# Patient Record
Sex: Female | Born: 1965
Health system: Southern US, Community
[De-identification: ages and names within clinical notes are randomized; demographics above are authoritative.]

## PROBLEM LIST (undated history)

## (undated) DIAGNOSIS — K635 Polyp of colon: Secondary | ICD-10-CM

## (undated) HISTORY — PX: KNEE SURGERY: SHX244

## (undated) HISTORY — PX: ABDOMINAL HYSTERECTOMY: SHX81

## (undated) HISTORY — DX: Polyp of colon: K63.5

---

## 1996-08-02 HISTORY — PX: ABDOMINAL HYSTERECTOMY: SHX81

## 1999-05-03 ENCOUNTER — Encounter: Payer: Self-pay | Admitting: Emergency Medicine

## 1999-05-03 ENCOUNTER — Emergency Department (HOSPITAL_COMMUNITY): Admission: EM | Admit: 1999-05-03 | Discharge: 1999-05-03 | Payer: Self-pay | Admitting: Emergency Medicine

## 1999-05-11 ENCOUNTER — Other Ambulatory Visit: Admission: RE | Admit: 1999-05-11 | Discharge: 1999-05-11 | Payer: Self-pay | Admitting: Obstetrics and Gynecology

## 1999-06-30 ENCOUNTER — Encounter: Admission: RE | Admit: 1999-06-30 | Discharge: 1999-06-30 | Payer: Self-pay | Admitting: Obstetrics and Gynecology

## 1999-06-30 ENCOUNTER — Encounter: Payer: Self-pay | Admitting: Obstetrics and Gynecology

## 1999-09-29 ENCOUNTER — Emergency Department (HOSPITAL_COMMUNITY): Admission: EM | Admit: 1999-09-29 | Discharge: 1999-09-29 | Payer: Self-pay | Admitting: Emergency Medicine

## 2000-07-18 ENCOUNTER — Other Ambulatory Visit: Admission: RE | Admit: 2000-07-18 | Discharge: 2000-07-18 | Payer: Self-pay | Admitting: Obstetrics and Gynecology

## 2000-08-25 ENCOUNTER — Encounter: Admission: RE | Admit: 2000-08-25 | Discharge: 2000-08-25 | Payer: Self-pay | Admitting: Obstetrics and Gynecology

## 2000-08-25 ENCOUNTER — Encounter: Payer: Self-pay | Admitting: Obstetrics and Gynecology

## 2001-08-29 ENCOUNTER — Encounter: Payer: Self-pay | Admitting: Obstetrics and Gynecology

## 2001-08-29 ENCOUNTER — Encounter: Admission: RE | Admit: 2001-08-29 | Discharge: 2001-08-29 | Payer: Self-pay | Admitting: Obstetrics and Gynecology

## 2001-09-01 ENCOUNTER — Other Ambulatory Visit: Admission: RE | Admit: 2001-09-01 | Discharge: 2001-09-01 | Payer: Self-pay | Admitting: Obstetrics and Gynecology

## 2005-01-04 ENCOUNTER — Encounter: Admission: RE | Admit: 2005-01-04 | Discharge: 2005-01-04 | Payer: Self-pay | Admitting: Obstetrics and Gynecology

## 2005-08-18 ENCOUNTER — Emergency Department (HOSPITAL_COMMUNITY): Admission: EM | Admit: 2005-08-18 | Discharge: 2005-08-18 | Payer: Self-pay | Admitting: Family Medicine

## 2006-03-16 ENCOUNTER — Other Ambulatory Visit: Admission: RE | Admit: 2006-03-16 | Discharge: 2006-03-16 | Payer: Self-pay | Admitting: Family Medicine

## 2006-03-18 ENCOUNTER — Encounter: Admission: RE | Admit: 2006-03-18 | Discharge: 2006-03-18 | Payer: Self-pay | Admitting: Family Medicine

## 2007-03-23 ENCOUNTER — Emergency Department (HOSPITAL_COMMUNITY): Admission: EM | Admit: 2007-03-23 | Discharge: 2007-03-23 | Payer: Self-pay | Admitting: Emergency Medicine

## 2007-05-12 ENCOUNTER — Other Ambulatory Visit: Admission: RE | Admit: 2007-05-12 | Discharge: 2007-05-12 | Payer: Self-pay | Admitting: Family Medicine

## 2007-06-27 ENCOUNTER — Ambulatory Visit (HOSPITAL_COMMUNITY): Admission: RE | Admit: 2007-06-27 | Discharge: 2007-06-27 | Payer: Self-pay | Admitting: Family Medicine

## 2008-03-20 ENCOUNTER — Emergency Department (HOSPITAL_COMMUNITY): Admission: EM | Admit: 2008-03-20 | Discharge: 2008-03-20 | Payer: Self-pay | Admitting: Emergency Medicine

## 2008-06-14 ENCOUNTER — Ambulatory Visit (HOSPITAL_COMMUNITY): Admission: RE | Admit: 2008-06-14 | Discharge: 2008-06-14 | Payer: Self-pay | Admitting: Family Medicine

## 2008-10-07 ENCOUNTER — Emergency Department (HOSPITAL_COMMUNITY): Admission: EM | Admit: 2008-10-07 | Discharge: 2008-10-07 | Payer: Self-pay | Admitting: Emergency Medicine

## 2008-10-07 ENCOUNTER — Encounter (INDEPENDENT_AMBULATORY_CARE_PROVIDER_SITE_OTHER): Payer: Self-pay | Admitting: Emergency Medicine

## 2008-10-07 ENCOUNTER — Ambulatory Visit: Payer: Self-pay | Admitting: Surgery

## 2008-12-02 ENCOUNTER — Encounter: Admission: RE | Admit: 2008-12-02 | Discharge: 2008-12-02 | Payer: Self-pay | Admitting: Family Medicine

## 2009-01-31 ENCOUNTER — Other Ambulatory Visit: Admission: RE | Admit: 2009-01-31 | Discharge: 2009-01-31 | Payer: Self-pay | Admitting: Family Medicine

## 2009-07-14 ENCOUNTER — Ambulatory Visit (HOSPITAL_COMMUNITY): Admission: RE | Admit: 2009-07-14 | Discharge: 2009-07-14 | Payer: Self-pay | Admitting: Family Medicine

## 2010-08-23 ENCOUNTER — Encounter: Payer: Self-pay | Admitting: Family Medicine

## 2011-03-31 ENCOUNTER — Other Ambulatory Visit (HOSPITAL_COMMUNITY): Payer: Self-pay | Admitting: Family Medicine

## 2011-03-31 DIAGNOSIS — Z1231 Encounter for screening mammogram for malignant neoplasm of breast: Secondary | ICD-10-CM

## 2011-04-09 ENCOUNTER — Ambulatory Visit (HOSPITAL_COMMUNITY): Payer: Self-pay | Attending: Family Medicine

## 2011-06-02 ENCOUNTER — Ambulatory Visit (HOSPITAL_COMMUNITY)
Admission: RE | Admit: 2011-06-02 | Discharge: 2011-06-02 | Disposition: A | Discharge status: Home / Self Care | Payer: Self-pay | Source: Ambulatory Visit | Attending: Family Medicine | Admitting: Family Medicine

## 2011-06-02 DIAGNOSIS — Z1231 Encounter for screening mammogram for malignant neoplasm of breast: Secondary | ICD-10-CM | POA: Insufficient documentation

## 2011-11-12 ENCOUNTER — Encounter (HOSPITAL_COMMUNITY): Payer: Self-pay | Admitting: Emergency Medicine

## 2011-11-12 ENCOUNTER — Emergency Department (HOSPITAL_COMMUNITY)
Admission: EM | Admit: 2011-11-12 | Discharge: 2011-11-12 | Disposition: A | Discharge status: Home / Self Care | Payer: Managed Care, Other (non HMO) | Attending: Emergency Medicine | Admitting: Emergency Medicine

## 2011-11-12 DIAGNOSIS — R079 Chest pain, unspecified: Secondary | ICD-10-CM | POA: Insufficient documentation

## 2011-11-12 DIAGNOSIS — R51 Headache: Secondary | ICD-10-CM

## 2011-11-12 MED ORDER — DIPHENHYDRAMINE HCL 50 MG/ML IJ SOLN
25.0000 mg | Freq: Once | INTRAMUSCULAR | Status: DC
  Filled 2011-11-12: qty 1

## 2011-11-12 MED ORDER — KETOROLAC TROMETHAMINE 15 MG/ML IJ SOLN
15.0000 mg | Freq: Once | INTRAMUSCULAR | Status: DC
  Filled 2011-11-12: qty 1

## 2011-11-12 MED ORDER — METOCLOPRAMIDE HCL 5 MG/ML IJ SOLN
10.0000 mg | Freq: Once | INTRAMUSCULAR | Status: DC
  Filled 2011-11-12: qty 2

## 2011-11-12 MED ORDER — IBUPROFEN 400 MG PO TABS: 400.0000 mg | ORAL_TABLET | Freq: Four times a day (QID) | ORAL | Status: AC | PRN

## 2011-11-12 MED ORDER — SODIUM CHLORIDE 0.9 % IV BOLUS (SEPSIS): 1000.0000 mL | Freq: Once | INTRAVENOUS | Status: DC

## 2011-11-12 NOTE — Discharge Instructions (Signed)
Headache, General, Unknown Cause  The specific cause of your headache may not have been found today. There are many causes and types of headache. A few common ones are:   Tension headache.    Migraine.    Infections (examples: dental and sinus infections).    Bone and/or joint problems in the neck or jaw.    Depression.    Eye problems.   These headaches are not life threatening.    Headaches can sometimes be diagnosed by a patient history and a physical exam. Sometimes, lab and imaging studies (such as x-ray and/or CT scan) are used to rule out more serious problems. In some cases, a spinal tap (lumbar puncture) may be requested. There are many times when your exam and tests may be normal on the first visit even when there is a serious problem causing your headaches. Because of that, it is very important to follow up with your doctor or local clinic for further evaluation.  FINDING OUT THE RESULTS OF TESTS   If a radiology test was performed, a radiologist will review your results.    You will be contacted by the emergency department or your physician if any test results require a change in your treatment plan.    Not all test results may be available during your visit. If your test results are not back during the visit, make an appointment with your caregiver to find out the results. Do not assume everything is normal if you have not heard from your caregiver or the medical facility. It is important for you to follow up on all of your test results.   HOME CARE INSTRUCTIONS     Keep follow-up appointments with your caregiver, or any specialist referral.    Only take over-the-counter or prescription medicines for pain, discomfort, or fever as directed by your caregiver.    Biofeedback, massage, or other relaxation techniques may be helpful.    Ice packs or heat applied to the head and neck can be used. Do this three to four times per day, or as needed.     Call your doctor if you have any questions or concerns.    If you smoke, you should quit.   SEEK MEDICAL CARE IF:     You develop problems with medications prescribed.    You do not respond to or obtain relief from medications.    You have a change from the usual headache.    You develop nausea or vomiting.   SEEK IMMEDIATE MEDICAL CARE IF:     If your headache becomes severe.    You have an unexplained oral temperature above 102 F (38.9 C), or as your caregiver suggests.    You have a stiff neck.    You have loss of vision.    You have muscular weakness.    You have loss of muscular control.    You develop severe symptoms different from your first symptoms.    You start losing your balance or have trouble walking.    You feel faint or pass out.   MAKE SURE YOU:     Understand these instructions.    Will watch your condition.    Will get help right away if you are not doing well or get worse.   Document Released: 07/19/2005 Document Revised: 07/08/2011 Document Reviewed: 03/07/2008  [employer] Patient Information 2012 [employer].    Headaches, Frequently Asked Questions  MIGRAINE HEADACHES  Q: What is migraine? What causes it? How   can I treat it?  A: Generally, migraine headaches begin as a dull ache. Then they develop into a constant, throbbing, and pulsating pain. You may experience pain at the temples. You may experience pain at the front or back of one or both sides of the head. The pain is usually accompanied by a combination of:   Nausea.    Vomiting.    Sensitivity to light and noise.   Some people (about 15%) experience an aura (see below) before an attack. The cause of migraine is believed to be chemical reactions in the brain. Treatment for migraine may include over-the-counter or prescription medications. It may also include self-help techniques. These include relaxation training and biofeedback.    Q: What is an aura?   A: About 15% of people with migraine get an "aura". This is a sign of neurological symptoms that occur before a migraine headache. You may see wavy or jagged lines, dots, or flashing lights. You might experience tunnel vision or blind spots in one or both eyes. The aura can include visual or auditory hallucinations (something imagined). It may include disruptions in smell (such as strange odors), taste or touch. Other symptoms include:   Numbness.    A "pins and needles" sensation.    Difficulty in recalling or speaking the correct word.   These neurological events may last as long as 60 minutes. These symptoms will fade as the headache begins.  Q: What is a trigger?  A: Certain physical or environmental factors can lead to or "trigger" a migraine. These include:   Foods.    Hormonal changes.    Weather.    Stress.   It is important to remember that triggers are different for everyone. To help prevent migraine attacks, you need to figure out which triggers affect you. Keep a headache diary. This is a good way to track triggers. The diary will help you talk to your healthcare professional about your condition.  Q: Does weather affect migraines?  A: Bright sunshine, hot, humid conditions, and drastic changes in barometric pressure may lead to, or "trigger," a migraine attack in some people. But studies have shown that weather does not act as a trigger for everyone with migraines.  Q: What is the link between migraine and hormones?  A: Hormones start and regulate many of your body's functions. Hormones keep your body in balance within a constantly changing environment. The levels of hormones in your body are unbalanced at times. Examples are during menstruation, pregnancy, or menopause. That can lead to a migraine attack. In fact, about three quarters of all women with migraine report that their attacks are related to the menstrual cycle.    Q: Is there an increased risk of stroke for migraine sufferers?   A: The likelihood of a migraine attack causing a stroke is very remote. That is not to say that migraine sufferers cannot have a stroke associated with their migraines. In persons under age 40, the most common associated factor for stroke is migraine headache. But over the course of a person's normal life span, the occurrence of migraine headache may actually be associated with a reduced risk of dying from cerebrovascular disease due to stroke.    Q: What are acute medications for migraine?  A: Acute medications are used to treat the pain of the headache after it has started. Examples over-the-counter medications, NSAIDs, ergots, and triptans.    Q: What are the triptans?  A: Triptans are the newest class of abortive   medications. They are specifically targeted to treat migraine. Triptans are vasoconstrictors. They moderate some chemical reactions in the brain. The triptans work on receptors in your brain. Triptans help to restore the balance of a neurotransmitter called serotonin. Fluctuations in levels of serotonin are thought to be a main cause of migraine.    Q: Are over-the-counter medications for migraine effective?  A: Over-the-counter, or "OTC," medications may be effective in relieving mild to moderate pain and associated symptoms of migraine. But you should see your caregiver before beginning any treatment regimen for migraine.    Q: What are preventive medications for migraine?  A: Preventive medications for migraine are sometimes referred to as "prophylactic" treatments. They are used to reduce the frequency, severity, and length of migraine attacks. Examples of preventive medications include antiepileptic medications, antidepressants, beta-blockers, calcium channel blockers, and NSAIDs (nonsteroidal anti-inflammatory drugs).  Q: Why are anticonvulsants used to treat migraine?   A: During the past few years, there has been an increased interest in antiepileptic drugs for the prevention of migraine. They are sometimes referred to as "anticonvulsants". Both epilepsy and migraine may be caused by similar reactions in the brain.    Q: Why are antidepressants used to treat migraine?  A: Antidepressants are typically used to treat people with depression. They may reduce migraine frequency by regulating chemical levels, such as serotonin, in the brain.    Q: What alternative therapies are used to treat migraine?  A: The term "alternative therapies" is often used to describe treatments considered outside the scope of conventional Western medicine. Examples of alternative therapy include acupuncture, acupressure, and yoga. Another common alternative treatment is herbal therapy. Some herbs are believed to relieve headache pain. Always discuss alternative therapies with your caregiver before proceeding. Some herbal products contain arsenic and other toxins.  TENSION HEADACHES  Q: What is a tension-type headache? What causes it? How can I treat it?  A: Tension-type headaches occur randomly. They are often the result of temporary stress, anxiety, fatigue, or anger. Symptoms include soreness in your temples, a tightening band-like sensation around your head (a "vice-like" ache). Symptoms can also include a pulling feeling, pressure sensations, and contracting head and neck muscles. The headache begins in your forehead, temples, or the back of your head and neck. Treatment for tension-type headache may include over-the-counter or prescription medications. Treatment may also include self-help techniques such as relaxation training and biofeedback.  CLUSTER HEADACHES  Q: What is a cluster headache? What causes it? How can I treat it?   A: Cluster headache gets its name because the attacks come in groups. The pain arrives with little, if any, warning. It is usually on one side of the head. A tearing or bloodshot eye and a runny nose on the same side of the headache may also accompany the pain. Cluster headaches are believed to be caused by chemical reactions in the brain. They have been described as the most severe and intense of any headache type. Treatment for cluster headache includes prescription medication and oxygen.  SINUS HEADACHES  Q: What is a sinus headache? What causes it? How can I treat it?  A: When a cavity in the bones of the face and skull (a sinus) becomes inflamed, the inflammation will cause localized pain. This condition is usually the result of an allergic reaction, a tumor, or an infection. If your headache is caused by a sinus blockage, such as an infection, you will probably have a fever. An x-ray will confirm a sinus   that is being overused. Sometimes your caregiver will gradually substitute a different type of treatment or medication. Stopping may be a challenge. Regularly overusing a medication increases the potential for serious side effects. Consult a caregiver if you regularly use headache medications more than 2 days per week or more than the label advises. ADDITIONAL QUESTIONS AND ANSWERS Q: What is biofeedback? A: Biofeedback is a self-help treatment. Biofeedback uses special equipment to monitor your body's involuntary physical responses. Biofeedback monitors:  Breathing.   Pulse.   Heart rate.   Temperature.   Muscle tension.   Brain activity.  Biofeedback helps you refine and perfect your relaxation exercises. You learn to control the physical  responses that are related to stress. Once the technique has been mastered, you do not need the equipment any more. Q: Are headaches hereditary? A: Four out of five (80%) of people that suffer report a family history of migraine. Scientists are not sure if this is genetic or a family predisposition. Despite the uncertainty, a child has a 50% chance of having migraine if one parent suffers. The child has a 75% chance if both parents suffer.  Q: Can children get headaches? A: By the time they reach high school, most young people have experienced some type of headache. Many safe and effective approaches or medications can prevent a headache from occurring or stop it after it has begun.  Q: What type of doctor should I see to diagnose and treat my headache? A: Start with your primary caregiver. Discuss his or her experience and approach to headaches. Discuss methods of classification, diagnosis, and treatment. Your caregiver may decide to recommend you to a headache specialist, depending upon your symptoms or other physical conditions. Having diabetes, allergies, etc., may require a more comprehensive and inclusive approach to your headache. The National Headache Foundation will provide, upon request, a list of [facility] physician members in your state. Document Released: 10/09/2003 Document Revised: 07/08/2011 Document Reviewed: 03/18/2008 [facility] Patient Information 2012 [place], [place].  RESOURCE GUIDE  Dental Problems  Patients with Medicaid: [place] [phone] [address]                                           [phone] [address] Phone:  [phone]                                                  Phone:  [phone]  If unable to pay or uninsured, contact:  [facility] or [facility] to become qualified for the adult dental clinic.  Chronic Pain Problems Contact [facility]  [phone] Patients need to be  referred by their primary care doctor.  Insufficient Money for Medicine Contact [employer]:  call "211" or [facility] (619)383-8119.  No Primary Care Doctor Call [facility]  [phone] Other agencies that provide inexpensive medical care    [facility]  [phone]    [facility]  [phone]    [facility]  [phone]    [facility]  [phone]    [facility]  [phone]    [facility]  [phone]  Psychological Services [facility]  [phone] [facility]  [phone] [facility]   7056999914 (emergency services 7036762561)  Substance Abuse Resources [facility]  [phone] [facility] [phone] [facility] [phone] Floydene Flock [phone] Residential & Outpatient Substance Abuse Program  [phone]  Abuse/Neglect [facility] Child Abuse Hotline (815) [phone] [facility] Child Abuse Hotline 5140260648 (After Hours)  Emergency Shelter [facility] [phone]  Maternity Homes [facility] at [facility] [place] [facility] 956-617-2378 [facility] [phone]  MRSA Hotline #:   (267) 248-0519    [facility] Resources  [facility] [place]     [employer]                          [facility] Dept. [address] [place]                       [address]      [address]  Blondell Reveal Phone:  [phone]                                   Phone:  [phone]                 Phone:  [phone]  [facility] Phone:  [phone]  [facility] Child Abuse Hotline [phone] [phone] (After Hours)

## 2011-11-12 NOTE — ED Provider Notes (Signed)
History    10 old female with headache. Gradual onset about a week ago. Denies trauma. Headache is achy and diffuse. No appreciable exacerbating relieving factors. No neck pain or neck stiffness. No fevers or chills. No acute visual complaints. Denies numbness, tingling or loss of strength. Denies use of blood thinning medication. Denies significant headache history but states that she has been under increased stress at work recently. No family history cerebral aneurysm that she is aware of.   CSN: [id number]  Arrival date & time 11/12/11  1724   First MD Initiated Contact with Patient 11/12/11 2044      Chief Complaint  Patient presents with  . Headache  . Chest Pain    (Consider location/radiation/quality/duration/timing/severity/associated sxs/prior treatment) HPI  History reviewed. No pertinent past medical history.  Past Surgical History  Procedure Date  . Abdominal hysterectomy     No family history on file.  History  Substance Use Topics  . Smoking status: Never Smoker   . Smokeless tobacco: Not on file  . Alcohol Use: No    OB History    Grav Para Term Preterm Abortions TAB SAB Ect Mult Living                  Review of Systems   Review of symptoms negative unless otherwise noted in HPI.   Allergies  Review of patient's allergies indicates no known allergies.  Home Medications  No current outpatient prescriptions on file.  BP 118/77  Pulse 77  Temp(Src) 99.1 F (37.3 C) (Oral)  Resp 20  SpO2 100%  Physical Exam  Nursing note and vitals reviewed. Constitutional: She is oriented to person, place, and time. She appears well-developed and well-nourished. No distress.  HENT:  Head: Normocephalic and atraumatic.  Right Ear: External ear normal.  Left Ear: External ear normal.  Mouth/Throat: Oropharynx is clear and moist.  Eyes: Conjunctivae are normal. Pupils are equal, round, and reactive to light. Right eye exhibits no discharge. Left eye  exhibits no discharge.  Neck: Normal range of motion. Neck supple.  Cardiovascular: Normal rate, regular rhythm and normal heart sounds.  Exam reveals no gallop and no friction rub.   No murmur heard. Pulmonary/Chest: Effort normal and breath sounds normal. No respiratory distress.  Abdominal: Soft. She exhibits no distension. There is no tenderness.  Musculoskeletal: She exhibits no edema and no tenderness.  Lymphadenopathy:    She has no cervical adenopathy.  Neurological: She is alert and oriented to person, place, and time. No cranial nerve deficit. She exhibits normal muscle tone. Coordination normal.       Good finger to nose and heel to shin testing bilaterally. Good rapid alternating finger movements. Gait is steady.  Skin: Skin is warm and dry.  Psychiatric: She has a normal mood and affect. Her behavior is normal. Thought content normal.    ED Course  Procedures (including critical care time)  Labs Reviewed - No data to display No results found.   1. Headache       MDM  Suspect primary HA. Consider emergent secondary causes such as bleed, infectious or mass but doubt. There is no history of trauma. Pt has a nonfocal neurological exam. Declined pain medications. Afebrile and neck supple. No use of blood thinning medication. Consider ocular etiology such as acute angle closure glaucoma but doubt. Pt denies acute change in visual acuity and eye exam unremarkable. Doubt temporal arteritis given age, no temporal tenderness and temporal artery pulsations palpable. Doubt CO poisoning.  No contacts with similar symptoms. Doubt venous thrombosis. Doubt carotid or vertebral arteries dissection. Symptoms improved with meds. Feel that can be safely discharged, but strict return precautions discussed. Outpt fu.         Raeford Razor, MD 11/23/11 [phone]

## 2011-11-12 NOTE — ED Notes (Signed)
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

## 2011-11-12 NOTE — ED Notes (Signed)
Pt presenting to ed with c/o headache pain with positive nausea no vomiting pt states she was confused earlier and called her pcp and they told her to present to the ed. Pt is alert and oriented at this time. Pt states she had chest pain earlier today but denies chest pain at this time.

## 2011-11-12 NOTE — ED Notes (Signed)
Pt prefers not to received IV medication,states she will better and had just rather have a few aspirin by mouth,  Will advise MD and follow orders

## 2011-11-12 NOTE — ED Notes (Signed)
Has been under a lot of stress w/a h/a x 1 week.  States today she has been having some chest pain w/confusion.  No neuro deficits noted on arrival.  States her RT side "feels funny" but no weakness or facial droop noted.

## 2011-12-23 ENCOUNTER — Other Ambulatory Visit (HOSPITAL_COMMUNITY)
Admission: RE | Admit: 2011-12-23 | Discharge: 2011-12-23 | Disposition: A | Discharge status: Home / Self Care | Payer: Managed Care, Other (non HMO) | Source: Ambulatory Visit | Attending: Family Medicine | Admitting: Family Medicine

## 2011-12-24 ENCOUNTER — Other Ambulatory Visit: Payer: Self-pay | Admitting: Family Medicine

## 2011-12-24 DIAGNOSIS — Z124 Encounter for screening for malignant neoplasm of cervix: Secondary | ICD-10-CM | POA: Insufficient documentation

## 2012-05-17 ENCOUNTER — Other Ambulatory Visit (HOSPITAL_COMMUNITY): Payer: Self-pay | Admitting: Family Medicine

## 2012-05-17 DIAGNOSIS — Z1231 Encounter for screening mammogram for malignant neoplasm of breast: Secondary | ICD-10-CM

## 2012-06-01 ENCOUNTER — Ambulatory Visit (HOSPITAL_COMMUNITY)
Admission: RE | Admit: 2012-06-01 | Discharge: 2012-06-01 | Disposition: A | Discharge status: Home / Self Care | Payer: Managed Care, Other (non HMO) | Source: Ambulatory Visit | Attending: Family Medicine | Admitting: Family Medicine

## 2012-06-01 DIAGNOSIS — Z1231 Encounter for screening mammogram for malignant neoplasm of breast: Secondary | ICD-10-CM | POA: Insufficient documentation

## 2013-03-15 ENCOUNTER — Ambulatory Visit (INDEPENDENT_AMBULATORY_CARE_PROVIDER_SITE_OTHER): Payer: 59 | Admitting: Family Medicine

## 2013-03-15 VITALS — BP 120/70 | HR 86 | Temp 98.0°F | Resp 16 | Ht 67.0 in | Wt 223.0 lb

## 2013-03-15 DIAGNOSIS — Z0000 Encounter for general adult medical examination without abnormal findings: Secondary | ICD-10-CM

## 2013-03-15 LAB — COMPREHENSIVE METABOLIC PANEL WITH GFR
ALT: 12 U/L (ref 0–35)
AST: 15 U/L (ref 0–37)
Albumin: 3.8 g/dL (ref 3.5–5.2)
CO2: 27 meq/L (ref 19–32)
Calcium: 9.3 mg/dL (ref 8.4–10.5)
Chloride: 103 meq/L (ref 96–112)
Creat: 0.73 mg/dL (ref 0.50–1.10)
Potassium: 4.2 meq/L (ref 3.5–5.3)
Sodium: 137 meq/L (ref 135–145)
Total Protein: 7.2 g/dL (ref 6.0–8.3)

## 2013-03-15 LAB — POCT URINALYSIS DIPSTICK
Bilirubin, UA: NEGATIVE
Glucose, UA: NEGATIVE
Ketones, UA: NEGATIVE
Leukocytes, UA: NEGATIVE
Nitrite, UA: NEGATIVE
Protein, UA: NEGATIVE
Spec Grav, UA: 1.015
Urobilinogen, UA: 0.2
pH, UA: 6.5

## 2013-03-15 LAB — POCT CBC
Granulocyte percent: 53.3 %G (ref 37–80)
HCT, POC: 38.2 % (ref 37.7–47.9)
Hemoglobin: 12 g/dL — AB (ref 12.2–16.2)
Lymph, poc: 1.9 (ref 0.6–3.4)
MCH, POC: 28.2 pg (ref 27–31.2)
MCHC: 31.4 g/dL — AB (ref 31.8–35.4)
MCV: 89.9 fL (ref 80–97)
MID (cbc): 0.4 (ref 0–0.9)
MPV: 8.6 fL (ref 0–99.8)
POC Granulocyte: 2.6 (ref 2–6.9)
POC LYMPH PERCENT: 39.4 %L (ref 10–50)
POC MID %: 7.3 % (ref 0–12)
Platelet Count, POC: 292 10*3/uL (ref 142–424)
RBC: 4.25 M/uL (ref 4.04–5.48)
RDW, POC: 13.6 %
WBC: 4.9 10*3/uL (ref 4.6–10.2)

## 2013-03-15 LAB — LDL CHOLESTEROL, DIRECT: Direct LDL: 122 mg/dL — ABNORMAL HIGH

## 2013-03-15 LAB — COMPREHENSIVE METABOLIC PANEL
Alkaline Phosphatase: 54 U/L (ref 39–117)
BUN: 12 mg/dL (ref 6–23)
Glucose, Bld: 89 mg/dL (ref 70–99)
Total Bilirubin: 0.4 mg/dL (ref 0.3–1.2)

## 2013-03-15 LAB — IFOBT (OCCULT BLOOD): IFOBT: NEGATIVE

## 2013-03-15 LAB — POCT UA - MICROSCOPIC ONLY
Bacteria, U Microscopic: NEGATIVE
Casts, Ur, LPF, POC: NEGATIVE
Crystals, Ur, HPF, POC: NEGATIVE
Mucus, UA: NEGATIVE
Yeast, UA: NEGATIVE

## 2013-03-15 NOTE — Progress Notes (Signed)
[facility]:  Office Visit  Chief Complaint:  Chief Complaint  Patient presents with  . Annual Exam    HPI: Hailey Bryant is a 47 y.o. female who complains of : Here for annula, last annual was May 2013, mammogram was negative Dr. Clyde Bryant did a pelvic exam.  [phone] No menstrual cycle  Had cervical cancer 16 years ago, s/p partical hysterectomy Regular SBE Family history of breast cancer. Little sister, about 10 years ago at age 48, neg for BRCA Denies ovarian,  uterine cancer Colonoscopy done 5 years ago,  had one done again 2 years--no polyps, normal  History reviewed. No pertinent past medical history. Past Surgical History  Procedure Laterality Date  . Abdominal hysterectomy     History   Social History  . Marital Status: Married    Spouse Name: N/A    Number of Children: N/A  . Years of Education: N/A   Social History Main Topics  . Smoking status: Never Smoker   . Smokeless tobacco: None  . Alcohol Use: No  . Drug Use: No  . Sexual Activity: None   Other Topics Concern  . None   Social History Narrative  . None   History reviewed. No pertinent family history. No Known Allergies Prior to Admission medications   Not on File     ROS: The patient denies fevers, chills, night sweats, unintentional weight loss, chest pain, palpitations, wheezing, dyspnea on exertion, nausea, vomiting, abdominal pain, dysuria, hematuria, melena, numbness, weakness, or tingling.   All other systems have been reviewed and were otherwise negative with the exception of those mentioned in the HPI and as above.    PHYSICAL EXAM: Filed Vitals:   03/15/13 1349  BP: 120/70  Pulse: 86  Temp: 98 F (36.7 C)  Resp: 16   Filed Vitals:   03/15/13 1349  Height: 5\' 7"  (1.702 m)  Weight: 223 lb (101.152 kg)   Body mass index is 34.92 kg/(m^2).  General: Alert, no acute distress HEENT:  Normocephalic, atraumatic, oropharynx patent. EOMI, PERRLA,  fundoscopic exam nl Cardiovascular:  Regular rate and rhythm, no rubs murmurs or gallops.  No Carotid bruits, radial pulse intact. No pedal edema.  Respiratory: Clear to auscultation bilaterally.  No wheezes, rales, or rhonchi.  No cyanosis, no use of accessory musculature GI: No organomegaly, abdomen is soft and non-tender, positive bowel sounds.  No masses. Skin: No rashes. Neurologic: Facial musculature symmetric. Psychiatric: Patient is appropriate throughout our interaction. Lymphatic: No cervical or axiallary or subclavicular  lymphadenopathy Musculoskeletal: Gait intact. Breast exam and GU exam nl No cervix,rectal exam nl  LABS: Results for orders placed in visit on 03/15/13  COMPREHENSIVE METABOLIC PANEL      Result Value Range   Sodium 137  135 - 145 mEq/L   Potassium 4.2  3.5 - 5.3 mEq/L   Chloride 103  96 - 112 mEq/L   CO2 27  19 - 32 mEq/L   Glucose, Bld 89  70 - 99 mg/dL   BUN 12  6 - 23 mg/dL   Creat 0.45  4.09 - 8.11 mg/dL   Total Bilirubin 0.4  0.3 - 1.2 mg/dL   Alkaline Phosphatase 54  39 - 117 U/L   AST 15  0 - 37 U/L   ALT 12  0 - 35 U/L   Total Protein 7.2  6.0 - 8.3 g/dL   Albumin 3.8  3.5 - 5.2 g/dL   Calcium 9.3  8.4 - 91.4  mg/dL  TSH      Result Value Range   TSH 0.994  0.350 - 4.500 uIU/mL  LDL CHOLESTEROL, DIRECT      Result Value Range   Direct LDL 122 (*)   POCT CBC      Result Value Range   WBC 4.9  4.6 - 10.2 K/uL   Lymph, poc 1.9  0.6 - 3.4   POC LYMPH PERCENT 39.4  10 - 50 %L   MID (cbc) 0.4  0 - 0.9   POC MID % 7.3  0 - 12 %M   POC Granulocyte 2.6  2 - 6.9   Granulocyte percent 53.3  37 - 80 %G   RBC 4.25  4.04 - 5.48 M/uL   Hemoglobin 12.0 (*) 12.2 - 16.2 g/dL   HCT, POC 40.9  81.1 - 47.9 %   MCV 89.9  80 - 97 fL   MCH, POC 28.2  27 - 31.2 pg   MCHC 31.4 (*) 31.8 - 35.4 g/dL   RDW, POC 91.4     Platelet Count, POC 292  142 - 424 K/uL   MPV 8.6  0 - 99.8 fL  IFOBT (OCCULT BLOOD)      Result Value Range   IFOBT Negative    POCT  UA - MICROSCOPIC ONLY      Result Value Range   WBC, Ur, HPF, POC 0-1     RBC, urine, microscopic 0-1     Bacteria, U Microscopic neg     Mucus, UA neg     Epithelial cells, urine per micros 1-3     Crystals, Ur, HPF, POC neg     Casts, Ur, LPF, POC neg     Yeast, UA neg    POCT URINALYSIS DIPSTICK      Result Value Range   Color, UA yellow     Clarity, UA clear     Glucose, UA neg     Bilirubin, UA neg     Ketones, UA neg     Spec Grav, UA 1.015     Blood, UA trace     pH, UA 6.5     Protein, UA neg     Urobilinogen, UA 0.2     Nitrite, UA neg     Leukocytes, UA Negative       EKG/XRAY:   Primary read interpreted by Dr[facility] Conley Bryant at [facility].   ASSESSMENT/PLAN: Encounter Diagnosis  Name Primary?  . Annual physical exam Yes    Mammogram schedule as normal No pap since she does not have cervix UA was collected after speculum exam so may have irritated lining F/u on annual labs F/u prn or in 1 year for annual   Hailey Nodine PHUONG, DO 03/17/2013 7:19 AM

## 2013-03-16 LAB — TSH: TSH: 0.994 u[IU]/mL (ref 0.350–4.500)

## 2013-03-30 ENCOUNTER — Telehealth: Payer: Self-pay | Admitting: Family Medicine

## 2013-03-30 NOTE — Telephone Encounter (Signed)
LM about labs

## 2013-06-08 ENCOUNTER — Other Ambulatory Visit (HOSPITAL_COMMUNITY): Payer: Self-pay | Admitting: Family Medicine

## 2013-06-08 DIAGNOSIS — Z1231 Encounter for screening mammogram for malignant neoplasm of breast: Secondary | ICD-10-CM

## 2013-06-27 ENCOUNTER — Ambulatory Visit (HOSPITAL_COMMUNITY): Payer: Managed Care, Other (non HMO)

## 2013-06-29 ENCOUNTER — Ambulatory Visit (HOSPITAL_COMMUNITY)
Admission: RE | Admit: 2013-06-29 | Discharge: 2013-06-29 | Disposition: A | Discharge status: Home / Self Care | Payer: 59 | Source: Ambulatory Visit | Attending: Family Medicine | Admitting: Family Medicine

## 2013-06-29 DIAGNOSIS — Z1231 Encounter for screening mammogram for malignant neoplasm of breast: Secondary | ICD-10-CM | POA: Insufficient documentation

## 2013-12-19 ENCOUNTER — Other Ambulatory Visit (HOSPITAL_COMMUNITY): Payer: Self-pay | Admitting: Family Medicine

## 2013-12-19 DIAGNOSIS — Z1231 Encounter for screening mammogram for malignant neoplasm of breast: Secondary | ICD-10-CM

## 2013-12-20 ENCOUNTER — Ambulatory Visit (HOSPITAL_COMMUNITY)
Admission: RE | Admit: 2013-12-20 | Discharge: 2013-12-20 | Disposition: A | Discharge status: Home / Self Care | Payer: 59 | Source: Ambulatory Visit | Attending: Family Medicine | Admitting: Family Medicine

## 2013-12-20 DIAGNOSIS — Z1231 Encounter for screening mammogram for malignant neoplasm of breast: Secondary | ICD-10-CM | POA: Insufficient documentation

## 2014-06-22 ENCOUNTER — Encounter (HOSPITAL_COMMUNITY): Payer: Self-pay | Admitting: Emergency Medicine

## 2014-06-22 ENCOUNTER — Emergency Department (HOSPITAL_COMMUNITY): Payer: 59

## 2014-06-22 ENCOUNTER — Emergency Department (HOSPITAL_COMMUNITY)
Admission: EM | Admit: 2014-06-22 | Discharge: 2014-06-23 | Disposition: A | Discharge status: Home / Self Care | Payer: 59 | Attending: Emergency Medicine | Admitting: Emergency Medicine

## 2014-06-22 DIAGNOSIS — R079 Chest pain, unspecified: Secondary | ICD-10-CM | POA: Diagnosis present

## 2014-06-22 DIAGNOSIS — R110 Nausea: Secondary | ICD-10-CM | POA: Diagnosis not present

## 2014-06-22 LAB — CBC
HCT: 36.5 % (ref 36.0–46.0)
HEMOGLOBIN: 12.3 g/dL (ref 12.0–15.0)
MCH: 28.2 pg (ref 26.0–34.0)
MCHC: 33.7 g/dL (ref 30.0–36.0)
MCV: 83.7 fL (ref 78.0–100.0)
PLATELETS: 301 10*3/uL (ref 150–400)
RBC: 4.36 MIL/uL (ref 3.87–5.11)
RDW: 12.8 % (ref 11.5–15.5)
WBC: 7.7 10*3/uL (ref 4.0–10.5)

## 2014-06-22 LAB — I-STAT TROPONIN, ED: TROPONIN I, POC: 0 ng/mL (ref 0.00–0.08)

## 2014-06-22 MED ORDER — ASPIRIN 325 MG PO TABS
325.0000 mg | ORAL_TABLET | Freq: Once | ORAL | Status: AC
  Administered 2014-06-23: 325 mg via ORAL
  Filled 2014-06-22: qty 1

## 2014-06-22 NOTE — ED Notes (Signed)
Pt. reports mid/right chest pain onset this evening with mild SOB and nausea.

## 2014-06-22 NOTE — ED Provider Notes (Signed)
CSN: [id number]     Arrival date & time 06/22/14  2314 History  This chart was scribed for Hailey Arthurs, MD by Hailey Bryant, ED Scribe. This patient was seen in room D30C/D30C and the patient's care was started 11:33 PM.     Chief Complaint  Patient presents with  . Chest Pain      The history is provided by the patient. No language interpreter was used.     HPI Comments:  Hailey Bryant is a 48 y.o. female who presents to the Emergency Department complaining of mild-moderate right sided chest pain that she describes as a tightness that started around 2215 today. Her symptom has been constant since onset but has improved since onset. She notes pain radiated down her RUE but denies radiation down her LUE and neck. She reports associated  mild nausea which has resolved. She denies SOB, diaphoresis and vomiting. She also denies a smoking hx, a h/o DM, heart disease and hypertension. She denies a family h/o the same. She notes pain started after stacking chairs today. No alleviating factors noted.   History reviewed. No pertinent past medical history. Past Surgical History  Procedure Laterality Date  . Abdominal hysterectomy    . Knee surgery     No family history on file. History  Substance Use Topics  . Smoking status: Never Smoker   . Smokeless tobacco: Not on file  . Alcohol Use: No   OB History    No data available     Review of Systems  Constitutional: Negative for diaphoresis.  Respiratory: Negative for shortness of breath.   Cardiovascular: Positive for chest pain.  Gastrointestinal: Positive for nausea. Negative for vomiting.  All other systems reviewed and are negative.     Allergies  Review of patient's allergies indicates no known allergies.  Home Medications   Prior to Admission medications   Not on File   BP 111/75 mmHg  Pulse 73  Temp(Src) 98.3 F (36.8 C)  Resp 14  Ht 5\' 8"  (1.727 m)  Wt 216 lb (97.977 kg)  BMI 32.85 kg/m2  SpO2  100% Physical Exam  Constitutional: She is oriented to person, place, and time. She appears well-developed and well-nourished.  HENT:  Head: Normocephalic and atraumatic.  Mouth/Throat: Oropharynx is clear and moist.  Eyes: Conjunctivae are normal.  Neck: No tracheal deviation present.  Cardiovascular: Normal rate, regular rhythm and normal heart sounds.   Pulmonary/Chest: Effort normal and breath sounds normal.  Non reproducible tenderness   Abdominal: She exhibits no distension.  Musculoskeletal: She exhibits no edema.  Neurological: She is alert and oriented to person, place, and time.  Skin: Skin is warm and dry.  Psychiatric: She has a normal mood and affect.  Nursing note and vitals reviewed.   ED Course  Procedures   DIAGNOSTIC STUDIES:  Oxygen Saturation is 99% on RA, normal by my interpretation.    COORDINATION OF CARE:  11:37 PM Discussed treatment plan with pt at bedside and pt agreed to plan.  Labs Review Labs Reviewed  BASIC METABOLIC PANEL - Abnormal; Notable for the following:    Glucose, Bld 113 (*)    All other components within normal limits  CBC  PRO B NATRIURETIC PEPTIDE  I-STAT TROPOININ, ED  Hailey Bryant, ED    Imaging Review Dg Chest 2 View  06/23/2014   CLINICAL DATA:  Shortness of breath with chest pain and nausea today. Initial encounter.  EXAM: CHEST  2 VIEW  COMPARISON:  None.  FINDINGS: The heart size and mediastinal contours are normal. The lungs are clear. There is no pleural effusion or pneumothorax. No acute osseous findings are identified.  IMPRESSION: No active cardiopulmonary process.   Electronically Signed   By: Camie Patience M.D.   On: 06/23/2014 00:01     EKG Interpretation   Date/Time:  Saturday June 22 2014 23:19:12 EST Ventricular Rate:  81 PR Interval:  138 QRS Duration: 72 QT Interval:  368 QTC Calculation: 427 R Axis:   41 Text Interpretation:  Normal sinus rhythm Normal ECG No significant change  since  last tracing Confirmed by Ashok Cordia  MD, Lennette Bihari ([id number]) on 06/22/2014  11:27:26 PM      MDM   Final diagnoses:  Chest pain  SOB (shortness of breath)   Hailey Bryant is a 48 y.o. female here with chest pain. Denies shortness of breath to me. Low risk for ACS. Trop neg x 2. I doubt PE. Pain free after ASA. Will have her f/u with PMD. Of note, she mentioned SOB in triage but denies it to me. BNP ordered in triage but I don't think she needs BNP.   I personally performed the services described in this documentation, which was scribed in my presence. The recorded information has been reviewed and is accurate.   Hailey Arthurs, MD 06/23/14 [phone]

## 2014-06-23 LAB — BASIC METABOLIC PANEL
ANION GAP: 14 (ref 5–15)
BUN: 17 mg/dL (ref 6–23)
CALCIUM: 9.3 mg/dL (ref 8.4–10.5)
CHLORIDE: 102 meq/L (ref 96–112)
CO2: 22 meq/L (ref 19–32)
Creatinine, Ser: 0.75 mg/dL (ref 0.50–1.10)
GFR calc Af Amer: >90 mL/min (ref >90)
GFR calc non Af Amer: >90 mL/min (ref >90)
GLUCOSE: 113 mg/dL — AB (ref 70–99)
POTASSIUM: 3.8 meq/L (ref 3.7–5.3)
Sodium: 138 mEq/L (ref 137–147)

## 2014-06-23 LAB — PRO B NATRIURETIC PEPTIDE: PRO B NATRI PEPTIDE: 31.8 pg/mL (ref 0–125)

## 2014-06-23 LAB — I-STAT TROPONIN, ED: TROPONIN I, POC: 0 ng/mL (ref 0.00–0.08)

## 2014-06-23 NOTE — Discharge Instructions (Signed)
Take tylenol, motrin for pain.   Follow up with your doctor. Get stress test outpatient.   Return to ER if you have severe chest pain, shortness of breath.

## 2014-07-24 ENCOUNTER — Ambulatory Visit (INDEPENDENT_AMBULATORY_CARE_PROVIDER_SITE_OTHER): Payer: 59 | Admitting: Emergency Medicine

## 2014-07-24 VITALS — BP 118/72 | HR 80 | Temp 98.6°F | Resp 16 | Ht 67.5 in | Wt 222.0 lb

## 2014-07-24 DIAGNOSIS — Z23 Encounter for immunization: Secondary | ICD-10-CM

## 2014-07-24 DIAGNOSIS — Z0000 Encounter for general adult medical examination without abnormal findings: Secondary | ICD-10-CM

## 2014-07-24 DIAGNOSIS — R87619 Unspecified abnormal cytological findings in specimens from cervix uteri: Secondary | ICD-10-CM

## 2014-07-24 LAB — COMPREHENSIVE METABOLIC PANEL
ALBUMIN: 3.8 g/dL (ref 3.5–5.2)
ALK PHOS: 50 U/L (ref 39–117)
ALT: 9 U/L (ref 0–35)
AST: 15 U/L (ref 0–37)
BUN: 12 mg/dL (ref 6–23)
CHLORIDE: 103 meq/L (ref 96–112)
CO2: 25 mEq/L (ref 19–32)
Calcium: 8.9 mg/dL (ref 8.4–10.5)
Creat: 0.73 mg/dL (ref 0.50–1.10)
GLUCOSE: 82 mg/dL (ref 70–99)
POTASSIUM: 4.5 meq/L (ref 3.5–5.3)
Sodium: 137 mEq/L (ref 135–145)
Total Bilirubin: 0.5 mg/dL (ref 0.2–1.2)
Total Protein: 7.2 g/dL (ref 6.0–8.3)

## 2014-07-24 LAB — LIPID PANEL
Cholesterol: 217 mg/dL — ABNORMAL HIGH (ref 0–200)
HDL: 72 mg/dL (ref >39)
LDL CALC: 134 mg/dL — AB (ref 0–99)
Total CHOL/HDL Ratio: 3 Ratio
Triglycerides: 54 mg/dL (ref <150)
VLDL: 11 mg/dL (ref 0–40)

## 2014-07-24 LAB — POCT CBC
GRANULOCYTE PERCENT: 53.9 % (ref 37–80)
HEMATOCRIT: 40.7 % (ref 37.7–47.9)
Hemoglobin: 12.9 g/dL (ref 12.2–16.2)
Lymph, poc: 2.5 (ref 0.6–3.4)
MCH, POC: 27.7 pg (ref 27–31.2)
MCHC: 31.7 g/dL — AB (ref 31.8–35.4)
MCV: 87.6 fL (ref 80–97)
MID (cbc): 0.4 (ref 0–0.9)
MPV: 7.5 fL (ref 0–99.8)
PLATELET COUNT, POC: 331 10*3/uL (ref 142–424)
POC GRANULOCYTE: 3.4 (ref 2–6.9)
POC LYMPH PERCENT: 39.1 %L (ref 10–50)
POC MID %: 7 %M (ref 0–12)
RBC: 4.65 M/uL (ref 4.04–5.48)
RDW, POC: 14.3 %
WBC: 6.3 10*3/uL (ref 4.6–10.2)

## 2014-07-24 LAB — TSH: TSH: 0.943 u[IU]/mL (ref 0.350–4.500)

## 2014-07-24 NOTE — Progress Notes (Addendum)
   Subjective:    Patient ID: Hailey Bryant, female    DOB: 09/21/1965, 48 y.o.   MRN: [id number] This chart was scribed for Arlyss Queen, MD by Marti Sleigh, Medical Scribe. This patient was seen in Room 12 and the patient's care was started at 10:00 AM.  Chief Complaint  Patient presents with  . Annual Exam    Pt is fasting. Pt does not need pap. Pt needs Tetanus vaccine     HPI  HPI Comments: Hailey Bryant is a 48 y.o. female who presents to [facility] needing a full physical. Pt has had her flu shot. Pt states she is well. Pt does not take any medications. Pt has a FMhx of COPD and HTN (mother), breast cancer (sister). Pt had a full hysterectomy in 1998.   Pt was seen in the ER one month ago due to chest pain. Pt states that she had lifted a large number of chairs that day and she was told at the ER that her pain was likely to be musculoskeletal. An EKG was done at that time, which was normal.   Pt works for [employer] and [employer].   Review of Systems  Constitutional: Negative for fever and chills.  Respiratory: Negative for shortness of breath.   Cardiovascular: Negative for chest pain.  Gastrointestinal: Negative for abdominal pain.       Objective:   Physical Exam  Constitutional: She is oriented to person, place, and time. She appears well-developed and well-nourished.  HENT:  Head: Normocephalic and atraumatic.  Eyes: Pupils are equal, round, and reactive to light.  Neck: Neck supple.  Cardiovascular: Normal rate and regular rhythm.   Pulmonary/Chest: Effort normal and breath sounds normal. No respiratory distress.  Neurological: She is alert and oriented to person, place, and time.  Skin: Skin is warm and dry.  Psychiatric: She has a normal mood and affect. Her behavior is normal.  Nursing note and vitals reviewed.  Results for orders placed or performed in visit on 07/24/14  POCT CBC  Result Value Ref Range   WBC 6.3 4.6 - 10.2 K/uL   Lymph, poc 2.5  0.6 - 3.4   POC LYMPH PERCENT 39.1 10 - 50 %L   MID (cbc) 0.4 0 - 0.9   POC MID % 7.0 0 - 12 %M   POC Granulocyte 3.4 2 - 6.9   Granulocyte percent 53.9 37 - 80 %G   RBC 4.65 4.04 - 5.48 M/uL   Hemoglobin 12.9 12.2 - 16.2 g/dL   HCT, POC 40.7 37.7 - 47.9 %   MCV 87.6 80 - 97 fL   MCH, POC 27.7 27 - 31.2 pg   MCHC 31.7 (A) 31.8 - 35.4 g/dL   RDW, POC 14.3 %   Platelet Count, POC 331 142 - 424 K/uL   MPV 7.5 0 - 99.8 fL       Assessment & Plan:   I encouraged her to exercise work on weight loss. Routine labs were done today. Her EKG does not show any acute changes. She was seen in the emergency room with right-sided chest pain which occurred after doing a lot of lifting at her [employer]. I told her she needed to have a Pap smear next year. T gap was given , and she has had  a flu shot.I personally performed the services described in this documentation, which was scribed in my presence. The recorded information has been reviewed and is accurate.

## 2014-08-01 ENCOUNTER — Telehealth: Payer: Self-pay | Admitting: Family Medicine

## 2014-08-01 NOTE — Telephone Encounter (Signed)
-----   Message from Madie Reno, [place] sent at 07/24/2014  9:39 AM EST ----- Please schedule pt to establish care with female provider.

## 2014-08-12 NOTE — Progress Notes (Signed)
LMVM advising patient of appt with Debbie on 09/16/14 @ 4pm.

## 2014-09-16 ENCOUNTER — Ambulatory Visit: Payer: Self-pay | Admitting: Family Medicine

## 2015-01-30 ENCOUNTER — Other Ambulatory Visit: Payer: Self-pay

## 2015-01-30 DIAGNOSIS — Z1231 Encounter for screening mammogram for malignant neoplasm of breast: Secondary | ICD-10-CM

## 2015-02-14 ENCOUNTER — Ambulatory Visit (HOSPITAL_COMMUNITY)
Admission: RE | Admit: 2015-02-14 | Discharge: 2015-02-14 | Disposition: A | Discharge status: Home / Self Care | Payer: 59 | Source: Ambulatory Visit | Attending: Family Medicine | Admitting: Family Medicine

## 2015-02-14 ENCOUNTER — Other Ambulatory Visit (HOSPITAL_COMMUNITY): Payer: Self-pay | Admitting: Family Medicine

## 2015-02-14 DIAGNOSIS — Z1231 Encounter for screening mammogram for malignant neoplasm of breast: Secondary | ICD-10-CM

## 2015-02-26 ENCOUNTER — Ambulatory Visit (INDEPENDENT_AMBULATORY_CARE_PROVIDER_SITE_OTHER): Payer: 59 | Admitting: Family Medicine

## 2015-02-26 VITALS — BP 118/70 | HR 67 | Temp 98.2°F | Resp 16 | Ht 68.0 in | Wt 224.0 lb

## 2015-02-26 DIAGNOSIS — Z124 Encounter for screening for malignant neoplasm of cervix: Secondary | ICD-10-CM

## 2015-02-26 DIAGNOSIS — Z13220 Encounter for screening for lipoid disorders: Secondary | ICD-10-CM | POA: Diagnosis not present

## 2015-02-26 DIAGNOSIS — Z131 Encounter for screening for diabetes mellitus: Secondary | ICD-10-CM | POA: Diagnosis not present

## 2015-02-26 DIAGNOSIS — Z0000 Encounter for general adult medical examination without abnormal findings: Secondary | ICD-10-CM | POA: Diagnosis not present

## 2015-02-26 DIAGNOSIS — Z8541 Personal history of malignant neoplasm of cervix uteri: Secondary | ICD-10-CM | POA: Diagnosis not present

## 2015-02-26 LAB — BASIC METABOLIC PANEL
BUN: 11 mg/dL (ref 7–25)
CHLORIDE: 102 meq/L (ref 98–110)
CO2: 31 mEq/L (ref 20–31)
Calcium: 9.4 mg/dL (ref 8.6–10.4)
Creat: 0.71 mg/dL (ref 0.50–1.05)
GLUCOSE: 89 mg/dL (ref 65–99)
POTASSIUM: 4.4 meq/L (ref 3.5–5.3)
Sodium: 139 mEq/L (ref 135–146)

## 2015-02-26 LAB — LIPID PANEL
Cholesterol: 228 mg/dL — ABNORMAL HIGH (ref 125–200)
HDL: 98 mg/dL (ref >=46)
LDL CALC: 114 mg/dL (ref <130)
Total CHOL/HDL Ratio: 2.3 Ratio (ref <=5.0)
Triglycerides: 78 mg/dL (ref <150)
VLDL: 16 mg/dL (ref <30)

## 2015-02-26 LAB — HEMOGLOBIN A1C
HEMOGLOBIN A1C: 5.7 % — AB (ref <5.7)
Mean Plasma Glucose: 117 mg/dL — ABNORMAL HIGH (ref <117)

## 2015-02-26 NOTE — Progress Notes (Signed)
[facility] [address], [place] [address] [phone]  Date:  02/26/2015   Name:  Keali Mccraw   DOB:  Mar 09, 1966   MRN:  [id number]  PCP:  No primary care provider on file.    Chief Complaint: Annual Exam   History of Present Illness:  Hailey Bryant is a 49 y.o. very pleasant female patient who presents with the following:  Here today for a CPE.  She last had a CPE in December but can have one per calander year.   She did have an abnormal pap in her 40s, ok since then She has one ovary left.  She had a possible low grade cervical cancer 18 years ago, a partial hyst followed.  She did not have radiation. She is not sure what if any follow-up she is currently supposed to have  She had oatmeal this am.    She has 3 children, the youngest about to go off to college, and 5 grands She is married Non smoker  Patient Active Problem List   Diagnosis Date Noted  . Abnormal Pap smear of cervix 07/24/2014    History reviewed. No pertinent past medical history.  Past Surgical History  Procedure Laterality Date  . Abdominal hysterectomy    . Knee surgery      History  Substance Use Topics  . Smoking status: Never Smoker   . Smokeless tobacco: Not on file  . Alcohol Use: No    Family History  Problem Relation Age of Onset  . Hypertension Mother   . Cancer Sister     No Known Allergies  Medication list has been reviewed and updated.  No current outpatient prescriptions on file prior to visit.   No current facility-administered medications on file prior to visit.    Review of Systems:  As per HPI- otherwise negative.   Physical Examination: Filed Vitals:   02/26/15 1017  BP: 118/70  Pulse: 67  Temp: 98.2 F (36.8 C)  Resp: 16   Filed Vitals:   02/26/15 1017  Height: 5\' 8"  (1.727 m)  Weight: 224 lb (101.606 kg)   Body mass index is 34.07 kg/(m^2). Ideal Body Weight: Weight in (lb) to have BMI = 25: 164.1  GEN: WDWN, NAD,  Non-toxic, A & O x 3, looks well, overweight HEENT: Atraumatic, Normocephalic. Neck supple. No masses, No LAD.  Bilateral TM wnl, oropharynx normal.  PEERL,EOMI.   Ears and Nose: No external deformity. CV: RRR, No M/G/R. No JVD. No thrill. No extra heart sounds. PULM: CTA B, no wheezes, crackles, rhonchi. No retractions. No resp. distress. No accessory muscle use. ABD: S, NT, ND No rebound. No HSM. EXTR: No c/c/e NEURO Normal gait.  PSYCH: Normally interactive. Conversant. Not depressed or anxious appearing.  Calm demeanor.  Breast: normal exam, no masses/ dimpling/ discharge Pelvic: normal post hysterectomy, no vaginal lesions or discharge.  Cervix not present. no adnexal tendereness or masses   Assessment and Plan: Physical exam  Screening for hyperlipidemia - Plan: Lipid panel  Screening for diabetes mellitus - Plan: Basic metabolic panel, Hemoglobin A1c  Screening for cervical cancer - Plan: Pap IG and HPV (high risk) DNA detection  History of cervical cancer  Labs pending as above She is obese but OW very health Discussed follow-up surveillance for her cervical cancer.  The optimum regimen is up for debate, but as she did not have RT continuing paps is a reasonable option. She is ok with this plan Encouraged her  to schedule colonoscopy soon  Signed Lamar Blinks, MD

## 2015-02-26 NOTE — Patient Instructions (Addendum)
It was great to see you today!  I will be in touch with your labs As we discussed, the optimum follow-up for your mild cervical cancer is up for debate.   It is reasonable to do a pap of the vaginal cuff (sincre you do not have a cervix)every year or every few years.  If you would like to see an OB-GYN to get their opinion that is fine with me  I am certainly glad to take care of you at the appointment center if you like.    Now that you are 49, it is time to get a screening colonoscopy. This can be arranged without a referral Please contact the GI doctor of your choice [facility] and [facility] are a couple of good choices in town) to make this appt

## 2015-02-28 ENCOUNTER — Encounter: Payer: Self-pay | Admitting: Family Medicine

## 2015-02-28 LAB — PAP IG AND HPV HIGH-RISK: HPV DNA HIGH RISK: NOT DETECTED

## 2015-03-03 ENCOUNTER — Ambulatory Visit: Payer: Managed Care, Other (non HMO)

## 2015-04-09 ENCOUNTER — Ambulatory Visit: Payer: 59 | Admitting: Family Medicine

## 2015-04-22 ENCOUNTER — Ambulatory Visit (INDEPENDENT_AMBULATORY_CARE_PROVIDER_SITE_OTHER): Payer: 59 | Admitting: Family Medicine

## 2015-04-22 VITALS — BP 110/70 | HR 56 | Temp 98.7°F | Resp 18 | Ht 68.0 in | Wt 227.0 lb

## 2015-04-22 DIAGNOSIS — M79632 Pain in left forearm: Secondary | ICD-10-CM

## 2015-04-22 DIAGNOSIS — M609 Myositis, unspecified: Secondary | ICD-10-CM

## 2015-04-22 DIAGNOSIS — M791 Myalgia: Secondary | ICD-10-CM

## 2015-04-22 DIAGNOSIS — IMO0001 Reserved for inherently not codable concepts without codable children: Secondary | ICD-10-CM

## 2015-04-22 MED ORDER — CYCLOBENZAPRINE HCL 5 MG PO TABS: 5.0000 mg | ORAL_TABLET | Freq: Three times a day (TID) | ORAL | Status: DC | PRN

## 2015-04-22 MED ORDER — MELOXICAM 7.5 MG PO TABS: ORAL_TABLET | ORAL | Status: DC

## 2015-04-22 NOTE — Patient Instructions (Signed)

## 2015-04-22 NOTE — Progress Notes (Signed)
[facility] [address] [place] [address] [phone]  Date:  04/22/2015   Name:  Hailey Bryant   DOB:  1966-01-12   MRN:  [id number]  PCP:  No primary care provider on file.    Chief Complaint: Hand Pain   History of Present Illness:  Hailey Bryant is a 49 y.o. 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  - This pain began yesterday at 4 pm at end of work - took 2 IBu this AM, which allowed her to finish the work day.  - She is now an extensive pain with flexion or extension of her forearm. - She denies any fevers chills or night sweats. - She denies having any injuries to the left upper extremity. - She denies any falls.  Patient Active Problem List   Diagnosis Date Noted  . History of cervical cancer 02/26/2015  . Abnormal Pap smear of cervix 07/24/2014    No past medical history on file.  Past Surgical History  Procedure Laterality Date  . Abdominal hysterectomy    . Knee surgery      Social History  Substance Use Topics  . Smoking status: Never Smoker   . Smokeless tobacco: None  . Alcohol Use: No    Family History  Problem Relation Age of Onset  . Hypertension Mother   . Cancer Sister     No Known Allergies  Medication list has been reviewed and updated.  No current outpatient prescriptions on file prior to visit.   No current facility-administered medications on file prior to visit.    Review of Systems:  Review of Systems  Constitutional: Negative for fever, chills and malaise/fatigue.  HENT: Negative for congestion.   Eyes: Negative for double vision.  Gastrointestinal: Negative for nausea, vomiting and abdominal pain.  Musculoskeletal: Positive for myalgias. Negative for joint pain and neck pain.  Skin: Negative for rash.  Neurological: Negative for dizziness, tingling, sensory change, focal weakness and  headaches.    Physical Examination: Filed Vitals:   04/22/15 1924  BP: 110/70  Pulse: 56  Temp: 98.7 F (37.1 C)  Resp: 18   Filed Vitals:   04/22/15 1924  Height: 5\' 8"  (1.727 m)  Weight: 227 lb (102.967 kg)   Body mass index is 34.52 kg/(m^2). Ideal Body Weight: Weight in (lb) to have BMI = 25: 164.1 GEN: WDWN, NAD, Non-toxic, A & O x 3 HEENT: Atraumatic, Normocephalic.  Ears and Nose: No external deformity. CV: RRR, No M/G/R. No JVD. No thrill. No extra heart sounds. PULM: CTA B, no wheezes, crackles, rhonchi. No retractions. No resp. distress. No accessory muscle use. EXTR: No c/c/e. Patient's veins in her left hand"deflate" with elevation. PSYCH: Normally interactive. Conversant. Not depressed or anxious appearing.  Calm demeanor.   [place] Inspection normal with no visible erythema or swelling. ROM smooth and normal with good flexion and extension and ulnar/radial deviation that is symmetrical with opposite wrist.patient has significant pain with terminal extension of the wrist. Palpation is abnormal with deep palpation of the flexor compartment of the forearm. Strength 5/5 in all directions With significant pain with resisted wrist extension and. Negative Finkelstein, tinel's and phalens. Pronation  Assessment and Plan: This appears to be an overuse myositis after starting her new job. We have prescribed for her Flexeril and Mobic.  We have advised her to take the Mobic  daily for the next week.  She denies having trouble with NSAIDs.  We asked her to take this with food.  She can take the Flexeril in the evening prior to bedtime.  Both of these medications are less than the highest dose and she could take 2 of either one if needed.  We also provided her with a work note suggesting she avoid any repetitive motion of her left wrist and forearm for the remainder of the work week.  When she does return to work she needs to the gradually introduced to her new job duties which  involve constant type pain throughout the day.  We will see her back as needed.  Signed Gerre Pebbles, MD

## 2015-04-23 NOTE — Progress Notes (Signed)
Patient discussed and examined with Dr. Williams. Agree with assessment and plan of care per his note.   

## 2015-05-14 ENCOUNTER — Encounter: Payer: Self-pay | Admitting: Family Medicine

## 2015-05-14 ENCOUNTER — Ambulatory Visit (INDEPENDENT_AMBULATORY_CARE_PROVIDER_SITE_OTHER): Payer: 59 | Admitting: Family Medicine

## 2015-05-14 VITALS — BP 130/82 | HR 88 | Temp 98.4°F | Resp 16 | Wt 221.0 lb

## 2015-05-14 DIAGNOSIS — R7303 Prediabetes: Secondary | ICD-10-CM | POA: Diagnosis not present

## 2015-05-14 DIAGNOSIS — Z7189 Other specified counseling: Secondary | ICD-10-CM | POA: Diagnosis not present

## 2015-05-14 DIAGNOSIS — Z7689 Persons encountering health services in other specified circumstances: Secondary | ICD-10-CM

## 2015-05-14 NOTE — Progress Notes (Signed)
[facility] [address], [place] [address] [phone]  Date:  05/14/2015   Name:  Hailey Bryant   DOB:  September 25, 1965   MRN:  [id number]  PCP:  No primary care provider on file.    Chief Complaint: establish care   History of Present Illness:  Hailey Bryant is a 49 y.o. very pleasant female patient who presents with the following:  Here today to establish care as PCP- I actually did a CPE for her back in July.  At that point her labs showed pre-diabetes, pap was normal.  Other labs looked ok She has lost a few pounds- she is working on making some lifestyle changes like bringing her lunch to work She knows that she is due for a colonoscopy, but would prefer to wait until next year to do this test   Wt Readings from Last 3 Encounters:  05/14/15 221 lb (100.245 kg)  04/22/15 227 lb (102.967 kg)  02/26/15 224 lb (101.606 kg)     Patient Active Problem List   Diagnosis Date Noted  . History of cervical cancer 02/26/2015  . Abnormal Pap smear of cervix 07/24/2014    No past medical history on file.  Past Surgical History  Procedure Laterality Date  . Abdominal hysterectomy    . Knee surgery      Social History  Substance Use Topics  . Smoking status: Never Smoker   . Smokeless tobacco: None  . Alcohol Use: No    Family History  Problem Relation Age of Onset  . Hypertension Mother   . Cancer Sister     No Known Allergies  Medication list has been reviewed and updated.  Current Outpatient Prescriptions on File Prior to Visit  Medication Sig Dispense Refill  . cyclobenzaprine (FLEXERIL) 5 MG tablet Take 1 tablet (5 mg total) by mouth 3 (three) times daily as needed for muscle spasms. (Patient not taking: Reported on 05/14/2015) 20 tablet 0  . meloxicam (MOBIC) 7.5 MG tablet 1 tablet daily for 1 week and then as needed. (Patient not taking: Reported on 05/14/2015) 14 tablet 0   No current facility-administered medications on file prior  to visit.    Review of Systems:  As per HPI- otherwise negative.   Physical Examination: Filed Vitals:   05/14/15 1046  BP: 130/82  Pulse:   Temp:   Resp:    Filed Vitals:   05/14/15 1044  Weight: 221 lb (100.245 kg)   Body mass index is 33.61 kg/(m^2). Ideal Body Weight:    GEN: WDWN, NAD, Non-toxic, A & O x 3, overweight, looks well HEENT: Atraumatic, Normocephalic. Neck supple. No masses, No LAD. Ears and Nose: No external deformity. CV: RRR, No M/G/R. No JVD. No thrill. No extra heart sounds. PULM: CTA B, no wheezes, crackles, rhonchi. No retractions. No resp. distress. No accessory muscle use. EXTR: No c/c/e NEURO Normal gait.  PSYCH: Normally interactive. Conversant. Not depressed or anxious appearing.  Calm demeanor.    Assessment and Plan: Establishing care with new doctor, encounter for  Pre-diabetes  She is working on her pre-diabetes and is making some progress- plan to recheck labs at her PE next May See patient instructions for more details.     Signed Lamar Blinks, MD

## 2015-05-14 NOTE — Patient Instructions (Signed)
You are now due for a colonoscopy-  it is likely ok to wait until next year but I would not put if off much longer If you prefer we could also do Cologuard testing- let me know if you are interested in this option  Please come and see me around your birthday for a physical and labs

## 2015-08-28 ENCOUNTER — Telehealth: Payer: Self-pay | Admitting: Family Medicine

## 2015-08-28 NOTE — Telephone Encounter (Signed)
lmom to call and reschedule appt with another provider [facility] was her provider

## 2015-12-05 IMAGING — CR DG CHEST 2V
2 series · 2 of 2 positions shown · non-contrast
Comparison: None.

CLINICAL DATA: Shortness of breath with chest pain and nausea
today. Initial encounter.

EXAM:
CHEST  2 VIEW

[chest pa]
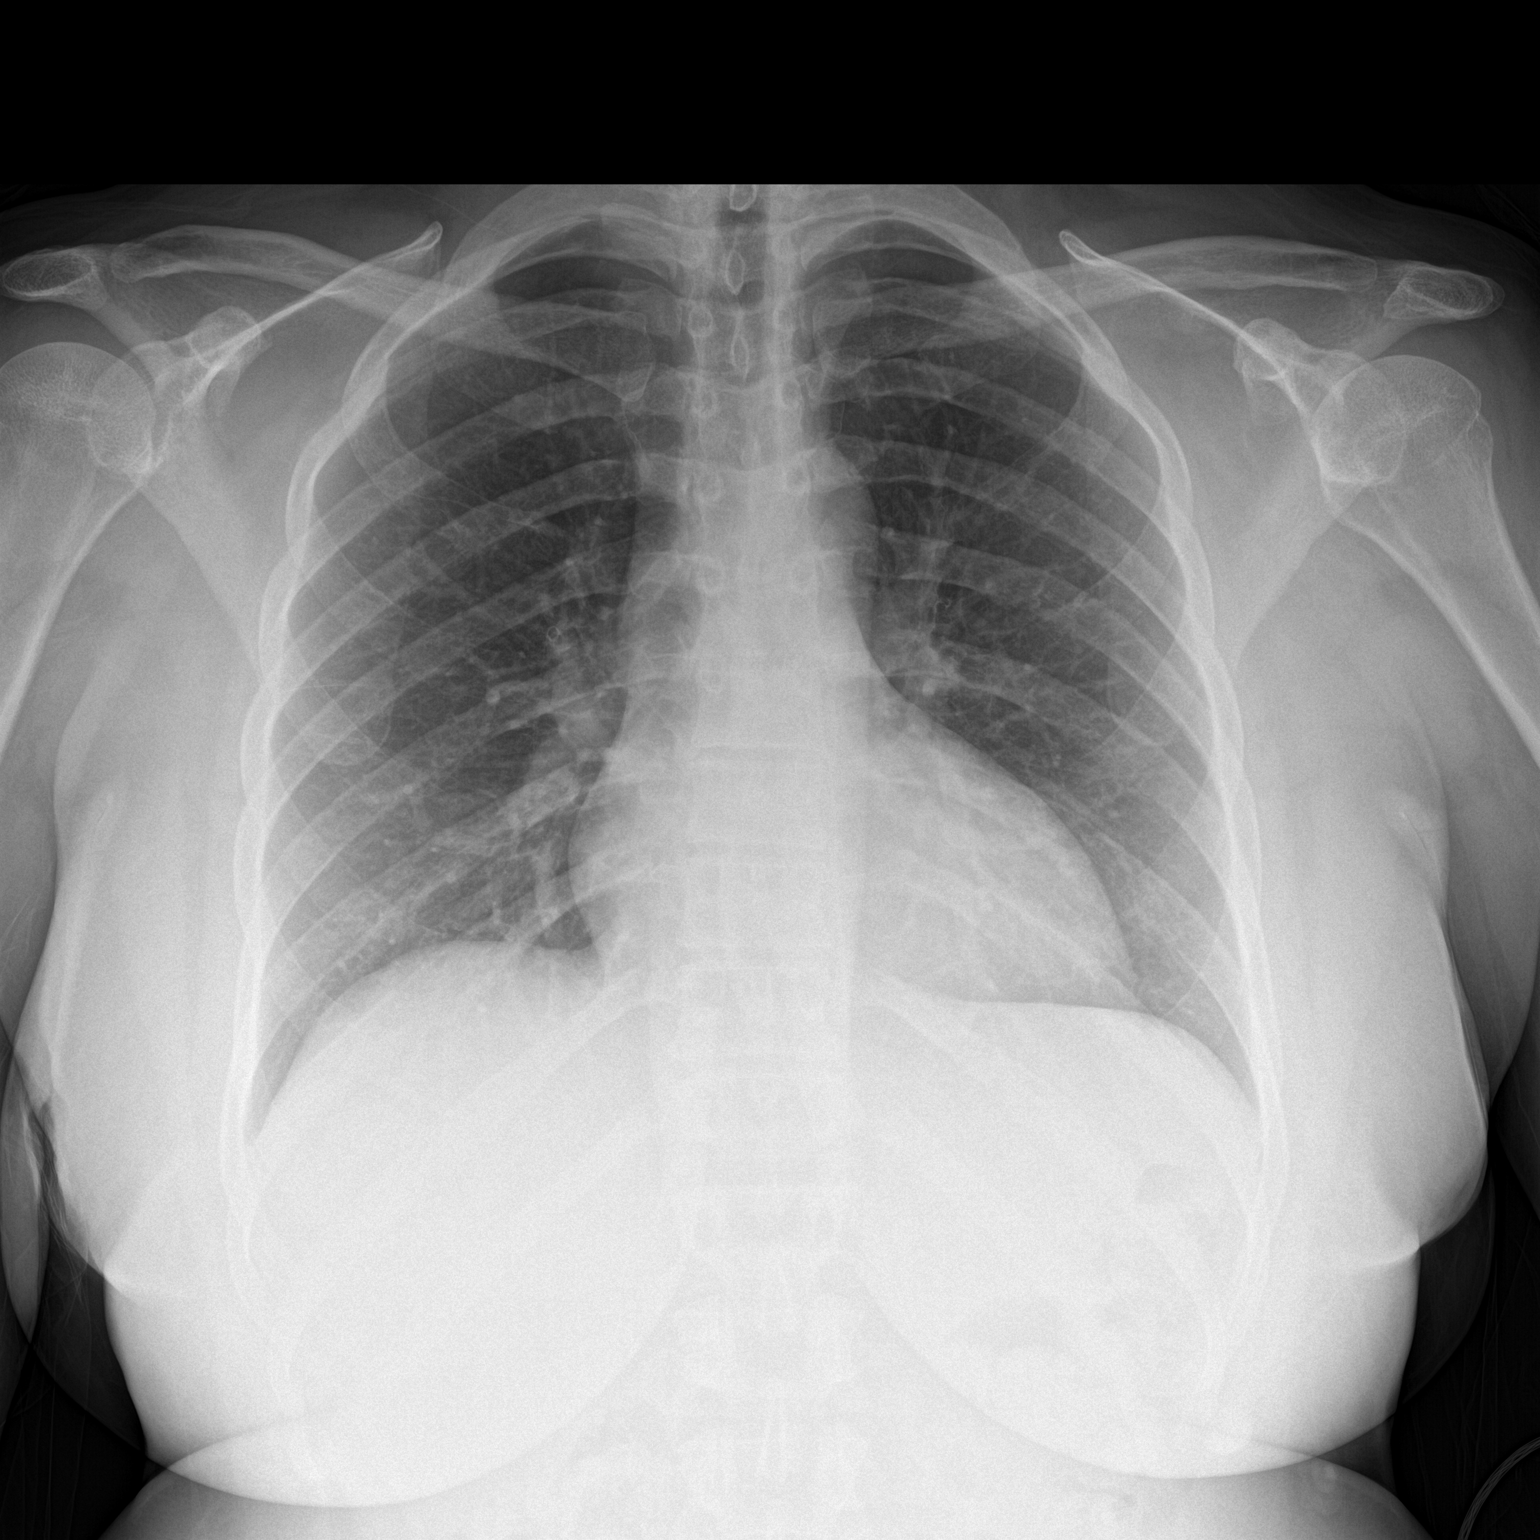

[chest lat]
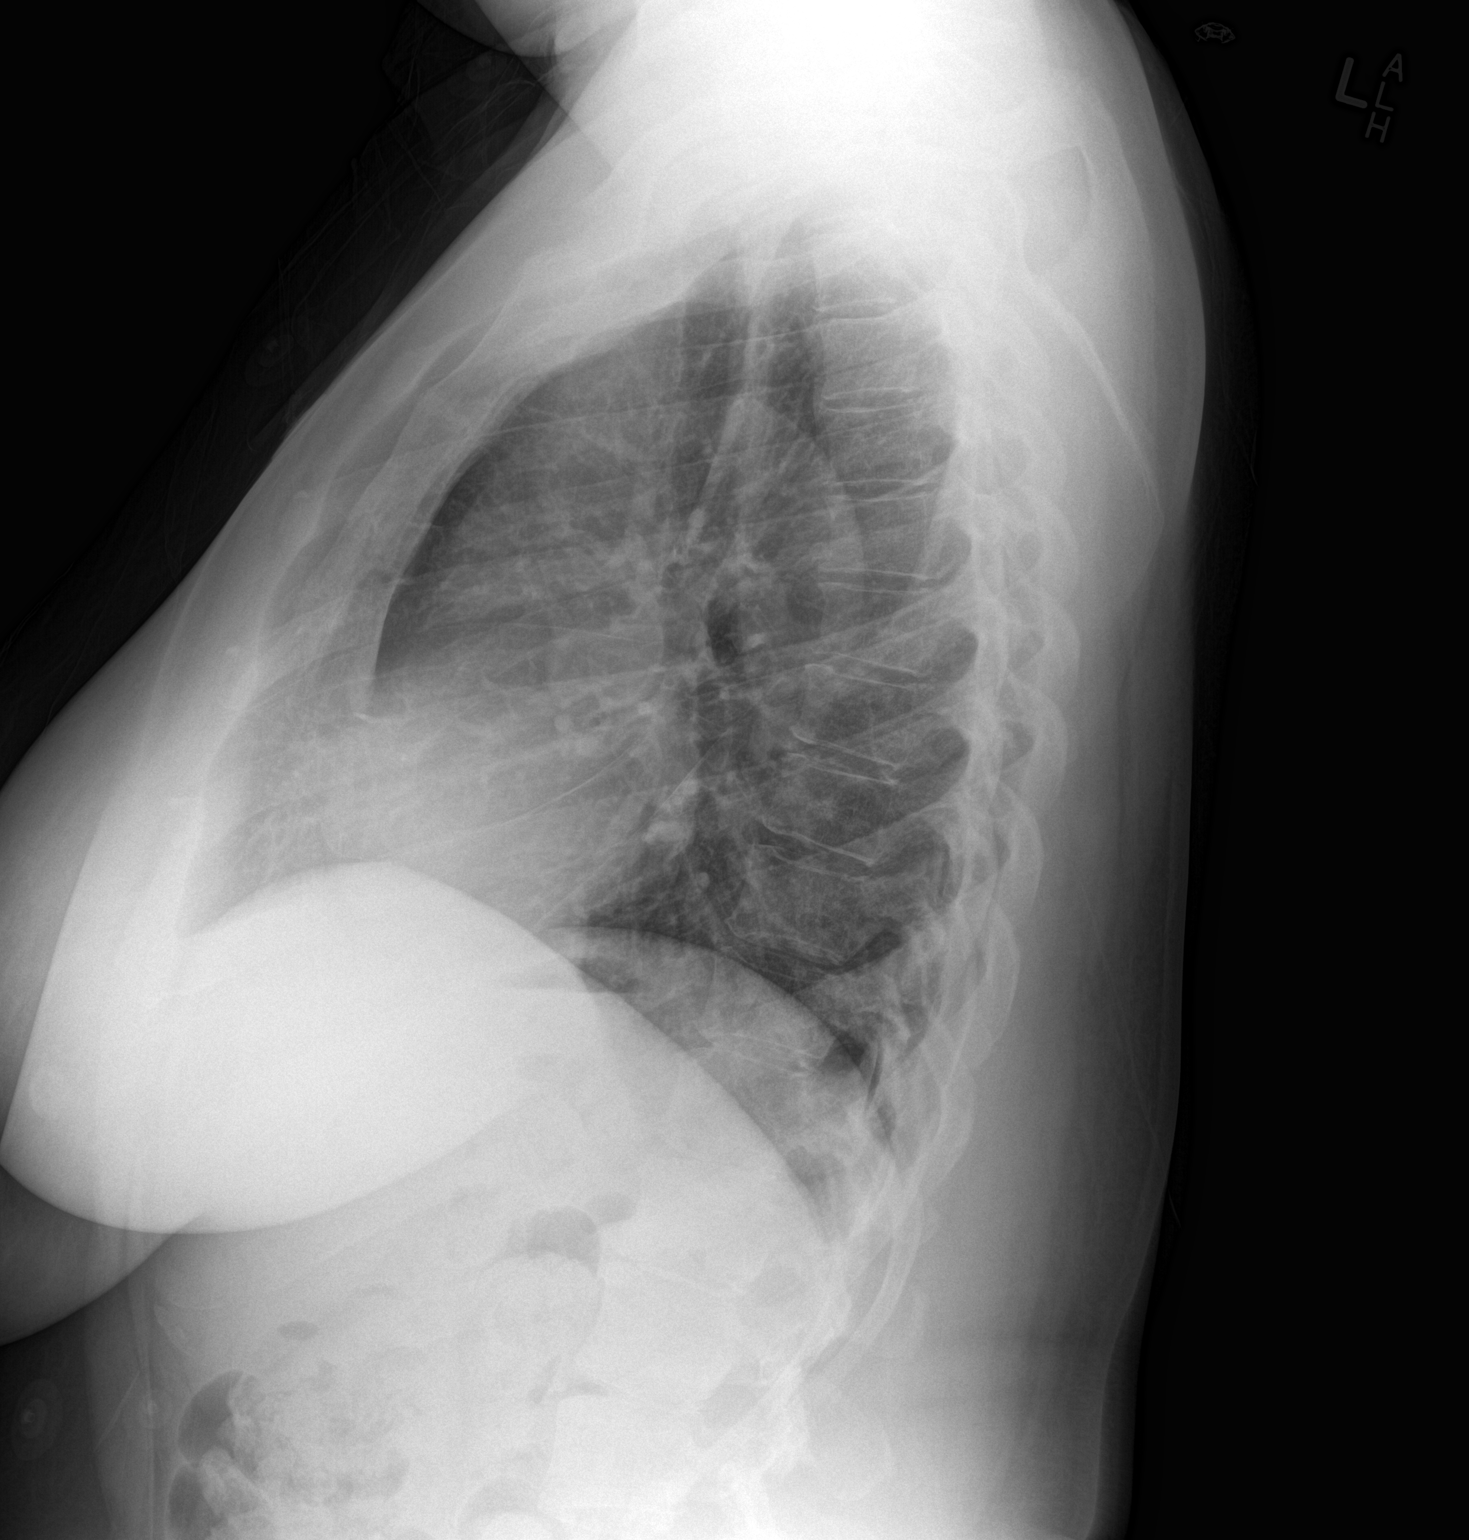

[2 of 2 positions shown; findings below may reference images not displayed]

FINDINGS: The heart size and mediastinal contours are normal. The lungs are
clear. There is no pleural effusion or pneumothorax. No acute
osseous findings are identified.
IMPRESSION: No active cardiopulmonary process.

## 2015-12-18 ENCOUNTER — Other Ambulatory Visit: Payer: Self-pay

## 2015-12-18 DIAGNOSIS — Z1231 Encounter for screening mammogram for malignant neoplasm of breast: Secondary | ICD-10-CM

## 2015-12-24 ENCOUNTER — Encounter: Payer: 59 | Admitting: Family Medicine

## 2016-01-12 ENCOUNTER — Encounter: Payer: Self-pay | Admitting: Family Medicine

## 2016-01-12 ENCOUNTER — Other Ambulatory Visit: Payer: Self-pay | Admitting: Family Medicine

## 2016-01-12 ENCOUNTER — Ambulatory Visit (HOSPITAL_BASED_OUTPATIENT_CLINIC_OR_DEPARTMENT_OTHER)
Admission: RE | Admit: 2016-01-12 | Discharge: 2016-01-12 | Disposition: A | Discharge status: Home / Self Care | Payer: 59 | Source: Ambulatory Visit | Attending: Family Medicine | Admitting: Family Medicine

## 2016-01-12 ENCOUNTER — Ambulatory Visit: Payer: Managed Care, Other (non HMO)

## 2016-01-12 ENCOUNTER — Ambulatory Visit (INDEPENDENT_AMBULATORY_CARE_PROVIDER_SITE_OTHER): Payer: 59 | Admitting: Family Medicine

## 2016-01-12 VITALS — BP 124/82 | HR 98 | Temp 98.1°F | Ht 67.0 in | Wt 229.6 lb

## 2016-01-12 DIAGNOSIS — Z130 Encounter for screening for diseases of the blood and blood-forming organs and certain disorders involving the immune mechanism: Secondary | ICD-10-CM

## 2016-01-12 DIAGNOSIS — Z1329 Encounter for screening for other suspected endocrine disorder: Secondary | ICD-10-CM | POA: Diagnosis not present

## 2016-01-12 DIAGNOSIS — Z1211 Encounter for screening for malignant neoplasm of colon: Secondary | ICD-10-CM

## 2016-01-12 DIAGNOSIS — Z0000 Encounter for general adult medical examination without abnormal findings: Secondary | ICD-10-CM | POA: Diagnosis not present

## 2016-01-12 DIAGNOSIS — Z1231 Encounter for screening mammogram for malignant neoplasm of breast: Secondary | ICD-10-CM

## 2016-01-12 DIAGNOSIS — Z13220 Encounter for screening for lipoid disorders: Secondary | ICD-10-CM | POA: Diagnosis not present

## 2016-01-12 DIAGNOSIS — R7303 Prediabetes: Secondary | ICD-10-CM

## 2016-01-12 DIAGNOSIS — E663 Overweight: Secondary | ICD-10-CM

## 2016-01-12 LAB — CBC
HCT: 36.9 % (ref 36.0–46.0)
Hemoglobin: 12.4 g/dL (ref 12.0–15.0)
MCHC: 33.5 g/dL (ref 30.0–36.0)
MCV: 85.5 fl (ref 78.0–100.0)
Platelets: 306 10*3/uL (ref 150.0–400.0)
RBC: 4.32 Mil/uL (ref 3.87–5.11)
RDW: 14.3 % (ref 11.5–15.5)
WBC: 5.3 10*3/uL (ref 4.0–10.5)

## 2016-01-12 LAB — LIPID PANEL
CHOL/HDL RATIO: 3
Cholesterol: 222 mg/dL — ABNORMAL HIGH (ref 0–200)
HDL: 75.7 mg/dL (ref >39.00)
LDL CALC: 136 mg/dL — AB (ref 0–99)
NonHDL: 146.29
TRIGLYCERIDES: 52 mg/dL (ref 0.0–149.0)
VLDL: 10.4 mg/dL (ref 0.0–40.0)

## 2016-01-12 LAB — COMPREHENSIVE METABOLIC PANEL
ALT: 12 U/L (ref 0–35)
AST: 17 U/L (ref 0–37)
Albumin: 3.8 g/dL (ref 3.5–5.2)
Alkaline Phosphatase: 47 U/L (ref 39–117)
BILIRUBIN TOTAL: 0.5 mg/dL (ref 0.2–1.2)
BUN: 13 mg/dL (ref 6–23)
CALCIUM: 9 mg/dL (ref 8.4–10.5)
CHLORIDE: 104 meq/L (ref 96–112)
CO2: 29 mEq/L (ref 19–32)
CREATININE: 0.67 mg/dL (ref 0.40–1.20)
GFR: 119.31 mL/min (ref >60.00)
Glucose, Bld: 87 mg/dL (ref 70–99)
Potassium: 4 mEq/L (ref 3.5–5.1)
Sodium: 137 mEq/L (ref 135–145)
Total Protein: 7.2 g/dL (ref 6.0–8.3)

## 2016-01-12 LAB — HEMOGLOBIN A1C: HEMOGLOBIN A1C: 5.6 % (ref 4.6–6.5)

## 2016-01-12 LAB — TSH: TSH: 1.07 u[IU]/mL (ref 0.35–4.50)

## 2016-01-12 NOTE — Progress Notes (Signed)
[facility] [address] [place], [place] [address] [phone]  Date:  01/12/2016   Name:  Hailey Bryant   DOB:  13-Feb-1966   MRN:  [id number]  PCP:  Lamar Blinks, MD    Chief Complaint: Annual Exam   History of Present Illness:  Hailey Bryant is a 50 y.o. very pleasant female patient who presents with the following:  Last seen by myself about 8 months ago. Here today for a CPE S/p partial hyst; we did a pap with HPV last year that was normal She did have an abnormal pap in her 56s, ok since then She has one ovary left. She had a possible low grade cervical cancer 19 years ago, a partial hyst followed. She did not have radiation. She is not sure what if any follow-up she is currently supposed to have   Colonoscopy: 5 years ago.  Labs: last year, A1c of 5.7  Mammo scheduled today; her sister had bilateral breast cancer in her late 42s so she does screening annually.  Apparently she does not have a particular genetic mutation that increases her personal cancer risk  She did have a colonoscopy with polyp removal at age 59; never had any canceous polyps.  She had a repeat 5 years later, also negative.  Told to follow-up in 5 years. She is interested in cologuard testing She is fasting today for labs   She has gained some weight- has just gotten back to exercise and is trying to walk 3 miles outdoors in the morning.  She has a treadmill for rainy or cold days.  Feels that her diet is already quite good; she prefers healthful foods   Wt Readings from Last 3 Encounters:  01/12/16 229 lb 9.6 oz (104.146 kg)  05/14/15 221 lb (100.245 kg)  04/22/15 227 lb (102.967 kg)    Patient Active Problem List   Diagnosis Date Noted  . Pre-diabetes 05/14/2015  . History of cervical cancer 02/26/2015  . Abnormal Pap smear of cervix 07/24/2014    No past medical history on file.  Past Surgical History  Procedure Laterality  Date  . Abdominal hysterectomy    . Knee surgery      Social History  Substance Use Topics  . Smoking status: Never Smoker   . Smokeless tobacco: None  . Alcohol Use: No    Family History  Problem Relation Age of Onset  . Hypertension Mother   . Cancer Sister     No Known Allergies  Medication list has been reviewed and updated.  No current outpatient prescriptions on file prior to visit.   No current facility-administered medications on file prior to visit.    Review of Systems:  As per HPI- otherwise negative.   Physical Examination: Filed Vitals:   01/12/16 0814  BP: 124/82  Pulse: 98  Temp: 98.1 F (36.7 C)   Filed Vitals:   01/12/16 0814  Height: 5\' 7"  (1.702 m)  Weight: 229 lb 9.6 oz (104.146 kg)   Body mass index is 35.95 kg/(m^2). Ideal Body Weight: Weight in (lb) to have BMI = 25: 159.3  GEN: WDWN, NAD, Non-toxic, A & O x 3, overweight, looks well today HEENT: Atraumatic, Normocephalic. Neck supple. No masses, No LAD.  Bilateral TM wnl, oropharynx normal.  PEERL,EOMI.   Ears and Nose: No external deformity. CV: RRR, No M/G/R. No JVD. No thrill. No extra heart sounds. PULM: CTA B, no wheezes, crackles,  rhonchi. No retractions. No resp. distress. No accessory muscle use. ABD: S, NT, ND. No rebound. No HSM. EXTR: No c/c/e NEURO Normal gait.  PSYCH: Normally interactive. Conversant. Not depressed or anxious appearing.  Calm demeanor.  Breast: normal exam, no masses/ dimpling/ discharge   Assessment and Plan: Physical exam  Screening for deficiency anemia - Plan: CBC  Screening for thyroid disorder - Plan: TSH  Screening for hyperlipidemia - Plan: Lipid panel  Special screening for malignant neoplasms, colon  Pre-diabetes - Plan: Comprehensive metabolic panel, Hemoglobin A1c  Overweight  Labs pending as above She plans to work on her exercise program more in hopes of weight loss Filled out cologuard forms for her today  Please do be  sure to do a mammo every year given your sister's history of cancer We will get labs for you today and will be in touch with results asap cologuard should get in touch with your regarding your test- let me know if you do not hear from them It was great to see you today!  Take care and good luck with your exercise program  Signed Lamar Blinks, MD

## 2016-01-12 NOTE — Progress Notes (Signed)
Pre visit review using our clinic review tool, if applicable. No additional management support is needed unless otherwise documented below in the visit note. 

## 2016-01-12 NOTE — Patient Instructions (Signed)
Please do be sure to do a mammo every year given your sister's history of cancer We will get labs for you today and will be in touch with results asap cologuard should get in touch with your regarding your test- let me know if you do not hear from them It was great to see you today!  Take care and good luck with your exercise program

## 2016-01-20 ENCOUNTER — Ambulatory Visit (HOSPITAL_BASED_OUTPATIENT_CLINIC_OR_DEPARTMENT_OTHER)
Admission: RE | Admit: 2016-01-20 | Discharge: 2016-01-20 | Disposition: A | Discharge status: Home / Self Care | Payer: 59 | Source: Ambulatory Visit | Attending: Family Medicine | Admitting: Family Medicine

## 2016-01-20 DIAGNOSIS — Z1231 Encounter for screening mammogram for malignant neoplasm of breast: Secondary | ICD-10-CM | POA: Diagnosis present

## 2016-02-12 ENCOUNTER — Telehealth: Payer: Self-pay | Admitting: Family Medicine

## 2016-02-12 NOTE — Telephone Encounter (Signed)
[employer] sent a letter that she has canceled her cologuard for now due to insurance problem.  They will re-activate the order if she does return her kit to the company

## 2016-04-01 LAB — HEMOGLOBIN A1C: HEMOGLOBIN A1C: 5.5

## 2016-04-01 LAB — LIPID PANEL
CHOLESTEROL: 227 mg/dL — AB (ref 0–200)
HDL: 84 mg/dL — AB (ref 35–70)
LDL Cholesterol: 128 mg/dL
Triglycerides: 63 mg/dL (ref 40–160)

## 2016-04-23 ENCOUNTER — Other Ambulatory Visit: Payer: Self-pay | Admitting: Family Medicine

## 2016-06-08 ENCOUNTER — Telehealth: Payer: Self-pay | Admitting: Family Medicine

## 2016-06-08 NOTE — Telephone Encounter (Signed)
Pt called in to request a colo guard. Pt says that it was once ordered by provider but at the time her insurance wouldn't cover. Pt says that they will now cover.   CB: [phone]

## 2016-06-09 NOTE — Telephone Encounter (Signed)
Tried to contact pt to inform that [place] request will be sent in per request. No answer, left voicemail for pt.

## 2016-07-05 ENCOUNTER — Ambulatory Visit (INDEPENDENT_AMBULATORY_CARE_PROVIDER_SITE_OTHER): Payer: 59 | Admitting: Family

## 2016-07-05 ENCOUNTER — Telehealth: Payer: Self-pay | Admitting: Family Medicine

## 2016-07-05 ENCOUNTER — Encounter: Payer: Self-pay | Admitting: Family

## 2016-07-05 VITALS — BP 110/80 | HR 86 | Temp 98.1°F | Resp 16 | Ht 67.0 in | Wt 228.2 lb

## 2016-07-05 DIAGNOSIS — K625 Hemorrhage of anus and rectum: Secondary | ICD-10-CM | POA: Diagnosis not present

## 2016-07-05 LAB — POCT HEMOGLOBIN: Hemoglobin: 14.3 g/dL (ref 12.2–16.2)

## 2016-07-05 MED ORDER — HYDROCORTISONE ACE-PRAMOXINE 1-1 % RE FOAM: 1.0000 | Freq: Two times a day (BID) | RECTAL | 0 refills | Status: DC

## 2016-07-05 NOTE — Progress Notes (Signed)
Subjective:    Patient ID: Hailey Bryant, female    DOB: November 10, 1965, 50 y.o.   MRN: [id number]  HPI  [name] is a 50 yr old female who presents today with chief complaint of rectal bleeding. Reports that rectal bleeding began about 2 weeks ago. She reports that around the time of thanksgiving she had a 5 day period of constipation. She then took some stool softeners and developed diarrhea.  She has had intermittent mucous which has been pink in color.   Denies associated fevers or abdominal pain.  She does endorse fatigue and rectal pressure but no pain. She reports that [place] and was positive for polyps. Denies known hx of hemorrhoids. Most recent stool today was soft but she did have some pink mucous on the tissue.      Review of Systems See HPI  No past medical history on file.   Social History   Social History  . Marital status: Married    Spouse name: N/A  . Number of children: N/A  . Years of education: N/A   Occupational History  . Not on file.   Social History Main Topics  . Smoking status: Never Smoker  . Smokeless tobacco: Not on file  . Alcohol use No  . Drug use: No  . Sexual activity: Not on file   Other Topics Concern  . Not on file   Social History Narrative  . No narrative on file    Past Surgical History:  Procedure Laterality Date  . ABDOMINAL HYSTERECTOMY    . KNEE SURGERY      Family History  Problem Relation Age of Onset  . Hypertension Mother   . Cancer Sister     No Known Allergies  No current outpatient prescriptions on file prior to visit.   No current facility-administered medications on file prior to visit.     BP 110/80 (BP Location: Right Arm, Cuff Size: Large)   Pulse 86   Temp 98.1 F (36.7 C) (Oral)   Resp 16   Ht 5\' 7"  (1.702 m)   Wt 228 lb 3.2 oz (103.5 kg)   SpO2 97%   BMI 35.74 kg/m       Objective:   Physical Exam  Constitutional: She is oriented  to person, place, and time. She appears well-developed and well-nourished.  Cardiovascular: Normal rate, regular rhythm and normal heart sounds.   No murmur heard. Pulmonary/Chest: Effort normal and breath sounds normal. No respiratory distress. She has no wheezes.  Abdominal: Soft. Normal appearance. She exhibits no distension. There is no tenderness. There is no rebound.  Genitourinary:  Genitourinary Comments: Small external hemorrhoid and small internal hemorrhoid noted.   Neurological: She is alert and oriented to person, place, and time.  Psychiatric: She has a normal mood and affect. Her behavior is normal. Judgment and thought content normal.          Assessment & Plan:  Rectal bleeding- I suspect that this is hemorrhoidal.  However, I think she would benefit from a GI consult since her last colo was >5 years ago and was positive for polyps.  I told her that she does not need to complete cologuard if she completes a colonoscopy.  We discussed avoiding constipation through high fiber diet and drinking plenty of water.  She is instructed to go to the ER if she develops large amount of blood in the stool. Point of care hemoglobin  today is 14.

## 2016-07-05 NOTE — Progress Notes (Signed)
Pre visit review using our clinic review tool, if applicable. No additional management support is needed unless otherwise documented below in the visit note. 

## 2016-07-05 NOTE — Telephone Encounter (Signed)
Noted. Patient has an appointment today with Inda Castle, NP at 4:00 PM.

## 2016-07-05 NOTE — Telephone Encounter (Signed)
Caller name: Hailey Bryant Relationship to patient: self Can be reached: [phone] (work #???)  Reason for call: Pt called stating had not received cologuard kit. Per [name] provided pt ph# [phone] for [employer]. Pt then stated blood and yellow mucus in bowels after thanskgiving and she took laxatives. Transferred to [place] with [facility].

## 2016-07-05 NOTE — Patient Instructions (Addendum)
You will be contacted about your referral to GI. To prevent constipation, please drink plenty of water, and eat lots of fresh fruits/veggies. Apply proctofoam ointment twice daily for the next 1 week.

## 2016-07-05 NOTE — Telephone Encounter (Signed)
Patient Name: Hailey Bryant  DOB: [birth year]    Initial Comment Caller states, she has blood and yellow mucus - stool. No appetite and lethargic.Verified    Nurse Assessment  Nurse: Raphael Gibney, RN, Vanita Ingles Date/Time (Eastern Time): 07/05/2016 10:19:30 AM  Confirm and document reason for call. If symptomatic, describe symptoms. ---Caller states she has yellow or red mucus in her stool. Started before Thanksgiving. Not much appetite. She was constipated before Thanksgiving. Miralax and 2 Dulcolax which helped the constipation. Not having regular BMs. BM consists of little soft balls. Has blood with her stools. No pain.  Does the patient have any new or worsening symptoms? ---Yes  Will a triage be completed? ---Yes  Related visit to physician within the last 2 weeks? ---No  Does the PT have any chronic conditions? (i.e. diabetes, asthma, etc.) ---No  Is the patient pregnant or possibly pregnant? (Ask all females between the ages of 43-55) ---No  Is this a behavioral health or substance abuse call? ---No     Guidelines    Guideline Title Affirmed Question Affirmed Notes  Rectal Bleeding MODERATE rectal bleeding (small blood clots, passing blood without stool, or toilet water turns red)    Final Disposition User   See Physician within 24 Hours [place], RN, Vanita Ingles    Comments  called primary number and left message. Will try secondary number.  appt scheduled for 07/05/2016 at 4 pm with Debbrah Alar at [facility].   Referrals  REFERRED TO PCP OFFICE   Disagree/Comply: Comply

## 2016-07-06 ENCOUNTER — Encounter: Payer: Self-pay | Admitting: Nurse Practitioner

## 2016-07-15 ENCOUNTER — Ambulatory Visit (INDEPENDENT_AMBULATORY_CARE_PROVIDER_SITE_OTHER): Payer: 59 | Admitting: Nurse Practitioner

## 2016-07-15 ENCOUNTER — Encounter: Payer: Self-pay | Admitting: Nurse Practitioner

## 2016-07-15 VITALS — BP 124/68 | HR 72 | Ht 67.5 in | Wt 229.0 lb

## 2016-07-15 DIAGNOSIS — K625 Hemorrhage of anus and rectum: Secondary | ICD-10-CM

## 2016-07-15 DIAGNOSIS — Z86010 Personal history of colonic polyps: Secondary | ICD-10-CM

## 2016-07-15 MED ORDER — NA SULFATE-K SULFATE-MG SULF 17.5-3.13-1.6 GM/177ML PO SOLN: 1.0000 | Freq: Once | ORAL | 0 refills | Status: AC

## 2016-07-15 NOTE — Patient Instructions (Signed)
If you are age 51 or older, your body mass index should be between 23-30. Your Body mass index is 35.34 kg/m. If this is out of the aforementioned range listed, please consider follow up with your Primary Care Provider.  If you are age 47 or younger, your body mass index should be between 19-25. Your Body mass index is 35.34 kg/m. If this is out of the aformentioned range listed, please consider follow up with your Primary Care Provider.   You have been scheduled for a colonoscopy. Please follow written instructions given to you at your visit today.  Please pick up your prep supplies at the pharmacy within the next 1-3 days. If you use inhalers (even only as needed), please bring them with you on the day of your procedure. Your physician has requested that you go to www.startemmi.com and enter the access code given to you at your visit today. This web site gives a general overview about your procedure. However, you should still follow specific instructions given to you by our office regarding your preparation for the procedure.  We have sent the following medications to your pharmacy for you to pick up at your convenience: Suprep  Thank you for choosing [facility]

## 2016-07-15 NOTE — Progress Notes (Addendum)
      HPI: Patient is 50 year old female referred by PCP, Lemar Livings, NP, for evaluation of rectal bleeding. She was constipated around Thanksgiving, took some stool softeners and developed loose stools. . No rectal pain. No abdominal pain. No nausea or unexpected weight loss. Since then she's had some intermittent blood (light pink) disease with bowel movements Patient reports occasional scant rectal bleeding which she thinks is secondary to hemorrhoids..   Patient had her first colonoscopy at age 72. Polyps removed. In 2010 she had a second colonoscopy, apparently no polyps found.   Past Medical History:  Diagnosis Date  . Colon polyps      Past Surgical History:  Procedure Laterality Date  . ABDOMINAL HYSTERECTOMY    . KNEE SURGERY     Family History  Problem Relation Age of Onset  . Hypertension Mother   . Cancer Sister   . Breast cancer Sister    Social History  Substance Use Topics  . Smoking status: Never Smoker  . Smokeless tobacco: Never Used  . Alcohol use No   Current Outpatient Prescriptions  Medication Sig Dispense Refill  . Multiple Vitamin (MULTIVITAMIN) tablet Take 1 tablet by mouth daily.     No current facility-administered medications for this visit.    No Known Allergies   Review of Systems: All systems reviewed and negative except where noted in HPI.    Physical Exam: BP 124/68   Pulse 72   Ht 5' 7.5" (1.715 m)   Wt 229 lb (103.9 kg)   BMI 35.34 kg/m  Constitutional:  Well-developed, white female in no acute distress. Psychiatric: Normal mood and affect. Behavior is normal. HEENT: Normocephalic and atraumatic. Conjunctivae are normal. No scleral icterus. Neck supple.  Cardiovascular: Normal rate, regular rhythm.  Pulmonary/chest: Effort normal and breath sounds normal. No wheezing, rales or rhonchi. Abdominal: Soft, nondistended, nontender. Bowel sounds active throughout. There are no masses palpable. No hepatomegaly. Extremities:  no edema Lymphadenopathy: No cervical adenopathy noted. Neurological: Alert and oriented to person place and time. Skin: Skin is warm and dry. No rashes noted.   ASSESSMENT AND PLAN:  50 year old female with scant painless rectal bleeding with bowel movements. Recent constipation, now resolved. Hemorrhoids found on PCPs exam. Patient reports history of colon polyps age 50 . Surveillance colonoscopy in 2010 was normal per patient -Rectal bleeding likely anorectal in nature but polyps, neoplasm needs to be excluded. Patient will be scheduled for a  colonoscopy with possible polypectomy.  The risks and benefits of the procedure were discussed and the patient agrees to proceed.    Tye Savoy, NP  07/15/2016, 8:56 AM  Cc: Debbrah Alar, NP   Addendum. Colonoscopy report received from [facility] dated 12/20/11. Procedure done for history of colon polyps. Extent of exam was to the cecum. Quality of the prep was fair. The entire colon was normal.   Note, patient has been scheduled to have a colonoscopy by [place] for evaluation of rectal bleeding. When I saw her in clinic she thought that her last colonoscopy was 2010. Will proceed repeat colonoscopy since the bowel prep on last colonoscopy was only fair.

## 2016-07-19 NOTE — Progress Notes (Signed)
Agree with assessment and plan as outlined.  

## 2016-08-05 ENCOUNTER — Telehealth: Payer: Self-pay

## 2016-08-05 NOTE — Telephone Encounter (Signed)
Patient returned my call from yesterday. I tried to call her back but had to leave a message for patient that the time has changed for her colonoscopy. It is still scheduled for 1/29 but she needs to arrive at 1:30 and procedure is at 2:30.

## 2016-08-05 NOTE — Telephone Encounter (Signed)
Patient returned phone call. She rescheduled procedure for late February stating that January is not a good month for her.

## 2016-08-30 ENCOUNTER — Encounter: Payer: 59 | Admitting: Gastroenterology

## 2016-09-13 ENCOUNTER — Encounter: Payer: Self-pay | Admitting: Gastroenterology

## 2016-09-16 ENCOUNTER — Telehealth: Payer: Self-pay | Admitting: Gastroenterology

## 2016-09-16 ENCOUNTER — Other Ambulatory Visit: Payer: Self-pay

## 2016-09-16 MED ORDER — NA SULFATE-K SULFATE-MG SULF 17.5-3.13-1.6 GM/177ML PO SOLN: 1.0000 | Freq: Once | ORAL | 0 refills | Status: AC

## 2016-09-27 ENCOUNTER — Encounter: Payer: Self-pay | Admitting: Gastroenterology

## 2016-09-27 ENCOUNTER — Ambulatory Visit (AMBULATORY_SURGERY_CENTER): Payer: 59 | Admitting: Gastroenterology

## 2016-09-27 VITALS — BP 124/94 | HR 67 | Temp 97.8°F | Resp 15 | Ht 67.5 in | Wt 229.0 lb

## 2016-09-27 DIAGNOSIS — K625 Hemorrhage of anus and rectum: Secondary | ICD-10-CM

## 2016-09-27 DIAGNOSIS — D122 Benign neoplasm of ascending colon: Secondary | ICD-10-CM

## 2016-09-27 DIAGNOSIS — D127 Benign neoplasm of rectosigmoid junction: Secondary | ICD-10-CM

## 2016-09-27 DIAGNOSIS — Z86010 Personal history of colonic polyps: Secondary | ICD-10-CM | POA: Diagnosis not present

## 2016-09-27 MED ORDER — SODIUM CHLORIDE 0.9 % IV SOLN: 500.0000 mL | INTRAVENOUS | Status: DC

## 2016-09-27 NOTE — Progress Notes (Signed)
To PACU, vss patent aw report to rn 

## 2016-09-27 NOTE — Patient Instructions (Signed)
YOU HAD AN ENDOSCOPIC PROCEDURE TODAY AT [facility]:   Refer to the procedure report that was given to you for any specific questions about what was found during the examination.  If the procedure report does not answer your questions, please call your gastroenterologist to clarify.  If you requested that your care partner not be given the details of your procedure findings, then the procedure report has been included in a sealed envelope for you to review at your convenience later.  YOU SHOULD EXPECT: Some feelings of bloating in the abdomen. Passage of more gas than usual.  Walking can help get rid of the air that was put into your GI tract during the procedure and reduce the bloating. If you had a lower endoscopy (such as a colonoscopy or flexible sigmoidoscopy) you may notice spotting of blood in your stool or on the toilet paper. If you underwent a bowel prep for your procedure, you may not have a normal bowel movement for a few days.  Please Note:  You might notice some irritation and congestion in your nose or some drainage.  This is from the oxygen used during your procedure.  There is no need for concern and it should clear up in a day or so.  SYMPTOMS TO REPORT IMMEDIATELY:   Following lower endoscopy (colonoscopy or flexible sigmoidoscopy):  Excessive amounts of blood in the stool  Significant tenderness or worsening of abdominal pains  Swelling of the abdomen that is new, acute  Fever of 100F or higher   For urgent or emergent issues, a gastroenterologist can be reached at any hour by calling [phone].   DIET:  We do recommend a small meal at first, but then you may proceed to your regular diet.  Drink plenty of fluids but you should avoid alcoholic beverages for 24 hours.  ACTIVITY:  You should plan to take it easy for the rest of today and you should NOT DRIVE or use heavy machinery until tomorrow (because of the sedation medicines used during the test).     FOLLOW UP: Our staff will call the number listed on your records the next business day following your procedure to check on you and address any questions or concerns that you may have regarding the information given to you following your procedure. If we do not reach you, we will leave a message.  However, if you are feeling well and you are not experiencing any problems, there is no need to return our call.  We will assume that you have returned to your regular daily activities without incident.  If any biopsies were taken you will be contacted by phone or by letter within the next 1-3 weeks.  Please call us at [phone] if you have not heard about the biopsies in 3 weeks.    SIGNATURES/CONFIDENTIALITY: You and/or your care partner have signed paperwork which will be entered into your electronic medical record.  These signatures attest to the fact that that the information above on your After Visit Summary has been reviewed and is understood.  Full responsibility of the confidentiality of this discharge information lies with you and/or your care-partner.  No ibuprofen,naproxen,or other non-steroidal anti-inflammatory drugs for 2 weeks,resume remainder of medications. Information given on polyps,hemorrhoids and high fiber diet.

## 2016-09-27 NOTE — Op Note (Signed)
[name] Procedure Date: 09/27/2016 3:21 PM MRN: [id number] Endoscopist: Remo Lipps P. Armbruster MD, MD Age: 51 Referring MD:  Date of Birth: 1965-12-04 Gender: Female Account #: [id number] Procedure:                Colonoscopy Indications:              Hematochezia, history of polyps Medicines:                Monitored Anesthesia Care Procedure:                Pre-Anesthesia Assessment:                           - Prior to the procedure, a History and Physical                            was performed, and patient medications and                            allergies were reviewed. The patient's tolerance of                            previous anesthesia was also reviewed. The risks                            and benefits of the procedure and the sedation                            options and risks were discussed with the patient.                            All questions were answered, and informed consent                            was obtained. Prior Anticoagulants: The patient has                            taken no previous anticoagulant or antiplatelet                            agents. ASA Grade Assessment: II - A patient with                            mild systemic disease. After reviewing the risks                            and benefits, the patient was deemed in                            satisfactory condition to undergo the procedure.                           After obtaining informed consent, the colonoscope  was passed under direct vision. Throughout the                            procedure, the patient's blood pressure, pulse, and                            oxygen saturations were monitored continuously. The                            Model CF-HQ190L [id number] scope was introduced                            through the anus and advanced to the the terminal                            ileum, with  identification of the appendiceal                            orifice and IC valve. The colonoscopy was performed                            without difficulty. The patient tolerated the                            procedure well. The quality of the bowel                            preparation was adequate. The terminal ileum,                            ileocecal valve, appendiceal orifice, and rectum                            were photographed. Scope In: 3:33:12 PM Scope Out: 3:56:07 PM Scope Withdrawal Time: 0 hours 19 minutes 8 seconds  Total Procedure Duration: 0 hours 22 minutes 55 seconds  Findings:                 The perianal and digital rectal examinations were                            normal.                           Three sessile polyps were found in the ascending                            colon. The polyps were 3 to 6 mm in size. These                            polyps were removed with a cold snare. Resection                            and retrieval were complete.  A 4 mm polyp was found in the recto-sigmoid colon.                            The polyp was sessile. The polyp was removed with a                            cold snare. Resection and retrieval were complete.                           The terminal ileum appeared normal.                           Internal hemorrhoids were found during retroflexion.                           The exam was otherwise without abnormality. The                            bowel prep was only fair during insertion however                            following several minutes with lavaging the colon,                            adequate views were obtained for this exam. Complications:            No immediate complications. Estimated blood loss:                            Minimal. Estimated Blood Loss:     Estimated blood loss was minimal. Impression:               - Three 3 to 6 mm polyps in the ascending colon,                             removed with a cold snare. Resected and retrieved.                           - One 4 mm polyp at the recto-sigmoid colon,                            removed with a cold snare. Resected and retrieved.                           - The examined portion of the ileum was normal.                           - Internal hemorrhoids which are the most likely                            cause of the patient's symptoms.                           - The examination  was otherwise normal. Recommendation:           - Patient has a contact number available for                            emergencies. The signs and symptoms of potential                            delayed complications were discussed with the                            patient. Return to normal activities tomorrow.                            Written discharge instructions were provided to the                            patient.                           - Resume previous diet.                           - Continue present medications.                           - Daily fiber supplement for treatment of                            hemorrhoids                           - Follow up as needed in the clinic for                            consideration for banding procedure if symptoms                            persist                           - No ibuprofen, naproxen, or other non-steroidal                            anti-inflammatory drugs for 2 weeks after polyp                            removal.                           - Await pathology results.                           - Repeat colonoscopy is recommended for                            surveillance. The colonoscopy date will be  determined after pathology results from today's                            exam become available for review. Remo Lipps P. Armbruster MD, MD 09/27/2016 4:01:05 PM This report has been signed electronically.

## 2016-09-27 NOTE — Progress Notes (Signed)
Called to room to assist during endoscopic procedure.  Patient ID and intended procedure confirmed with present staff. Received instructions for my participation in the procedure from the performing physician.  

## 2016-09-28 NOTE — Telephone Encounter (Signed)
  Follow up Call-  Call back number 09/27/2016  Post procedure Call Back phone  # [phone]  Permission to leave phone message Yes  Some recent data might be hidden     Patient questions:  Do you have a fever, pain , or abdominal swelling? No. Pain Score  0 *  Have you tolerated food without any problems? Yes.    Have you been able to return to your normal activities? Yes.    Do you have any questions about your discharge instructions: Diet   No. Medications  No. Follow up visit  No.  Do you have questions or concerns about your Care? No.  Actions: * If pain score is 4 or above: No action needed, pain <4.

## 2016-10-01 ENCOUNTER — Encounter: Payer: Self-pay | Admitting: Gastroenterology

## 2016-12-08 ENCOUNTER — Ambulatory Visit (INDEPENDENT_AMBULATORY_CARE_PROVIDER_SITE_OTHER): Payer: 59 | Admitting: Physician Assistant

## 2016-12-08 DIAGNOSIS — Z111 Encounter for screening for respiratory tuberculosis: Secondary | ICD-10-CM

## 2016-12-08 NOTE — Patient Instructions (Signed)
     IF you received an x-ray today, you will receive an invoice from [facility]. Please contact [facility] at [phone] with questions or concerns regarding your invoice.   IF you received labwork today, you will receive an invoice from [facility]. Please contact [facility] at [phone] with questions or concerns regarding your invoice.   Our billing staff will not be able to assist you with questions regarding bills from these companies.  You will be contacted with the lab results as soon as they are available. The fastest way to get your results is to activate your My Chart account. Instructions are located on the last page of this paperwork. If you have not heard from us regarding the results in 2 weeks, please contact this office.     

## 2016-12-09 ENCOUNTER — Encounter: Payer: Self-pay | Admitting: Physician Assistant

## 2016-12-09 ENCOUNTER — Ambulatory Visit (INDEPENDENT_AMBULATORY_CARE_PROVIDER_SITE_OTHER): Payer: 59 | Admitting: Physician Assistant

## 2016-12-09 VITALS — BP 133/89 | HR 68 | Temp 98.4°F | Resp 17 | Ht 67.0 in | Wt 225.0 lb

## 2016-12-09 DIAGNOSIS — Z111 Encounter for screening for respiratory tuberculosis: Secondary | ICD-10-CM

## 2016-12-09 NOTE — Progress Notes (Signed)
  Tuberculosis Risk Questionnaire  1. Were you born outside the [place] in one of the following parts of the world: [place], [place], [place], [place] or [place]?  No     2.  Have you traveled outside the [place] and lived for more than one month in one of the following parts of the world: [place], [place], [place], [place] or [place]? NO     3. Do you have a compromised immune system such as from any of the following conditions:HIV/AIDS, organ or bone marrow transplantation, diabetes, immunosuppressive medicines (e.g. Prednisone, Remicaide), leukemia, lymphoma, cancer of the head or neck, gastrectomy or jejunal bypass, end-stage renal disease (on dialysis), or silicosis? No      4. Have you ever or do you plan on working in: a residential care center, a health care facility, a jail or prison or homeless shelter? Yes    5.  Have you ever: injected illegal drugs, used crack cocaine, lived in a homeless shelter  or been in jail or prison?  No     6. Have you ever been exposed to anyone with infectious tuberculosis?  No   Tuberculosis Symptom Questionnaire  Do you currently have any of the following symptoms?  1. Unexplained cough lasting more than 3 weeks? No   2.  Unexplained fever lasting more than 3 weeks. No   3.  Night Sweats (sweating that leaves the bedclothes and sheets wet)  No     4.  Shortness of Breath No   5. Chest Pain  No   6.  Unintentional weight loss  NO   7. Unexplained fatigue (very tired for no reason)  NO

## 2016-12-09 NOTE — Progress Notes (Signed)
   Hailey Bryant  MRN: [id number] DOB: 03-Jul-1966  Subjective:  Hailey Bryant is a 51 y.o. 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 Pt is a healthy individual and on no daily medications; she feels well today. Has no hx of postiive TB skin test. Last TB test >5 years ago. Pt works part time at a homeless shelter. No other questions or concerns.   Review of Systems  Constitutional: Negative for chills, diaphoresis, fatigue and fever.  Respiratory: Negative for cough and shortness of breath.   Gastrointestinal: Negative for abdominal pain, nausea and vomiting.    Patient Active Problem List   Diagnosis Date Noted  . Pre-diabetes 05/14/2015  . History of cervical cancer 02/26/2015  . Abnormal Pap smear of cervix 07/24/2014    No current outpatient prescriptions on file prior to visit.   Current Facility-Administered Medications on File Prior to Visit  Medication Dose Route Frequency Provider Last Rate Last Dose  . 0.9 %  sodium chloride infusion  500 mL Intravenous Continuous Armbruster, Renelda Loma, MD        No Known Allergies   Objective:  BP 133/89 (BP Location: Right Arm, Patient Position: Sitting, Cuff Size: Large)   Pulse 68   Temp 98.4 F (36.9 C) (Oral)   Resp 17   Ht 5\' 7"  (1.702 m)   Wt 225 lb (102.1 kg)   SpO2 99%   BMI 35.24 kg/m   Physical Exam  Constitutional: She is oriented to person, place, and time and well-developed, well-nourished, and in no distress.  HENT:  Head: Normocephalic and atraumatic.  Eyes: Conjunctivae are normal.  Neck: Normal range of motion.  Cardiovascular: Normal rate, regular rhythm and normal heart sounds.   Pulmonary/Chest: Effort normal and breath sounds normal.  Neurological: She is alert and oriented to person, place, and time. Gait normal.  Skin: Skin is warm and dry.  Psychiatric: Affect normal.  Vitals  reviewed.   Assessment and Plan :  1. Encounter for PPD test Follow up in 48-72 hours for PPD test read.  - TB Skin Test  Tenna Delaine, PA-C  [facility] 12/09/2016 2:11 PM

## 2016-12-09 NOTE — Patient Instructions (Addendum)
Please follow up in 48-72 hours for skin test reading. Thank you for letting me participate in your health and well being.   Tuberculin Skin Test Why am I having this test? Tuberculosis (TB) is a bacterial infection caused by Mycobacterium tuberculosis. Most people who are exposed to these bacteria have a strong enough defense (immune) system to prevent the bacteria from causing TB and developing symptoms. Their bodies prevent the germs from being active and making them sick (latent TB infection). However, if you have TB germs in your body and your immune system is weak, you can develop a TB infection. This can cause symptoms such as:  Night sweats.  Fever.  Weakness.  Weight loss. A latent TB infection can also become active later in life if your immune system becomes weakened or compromised. You may have this test if your health care provider suspects that you have TB. You may also have this test to screen for TB if you are at risk for getting the disease. Those at increased risk include:  People who inject illegal drugs or share needles.  People with HIV or other diseases that affect immunity.  Health care workers.  People who live in high-risk communities, such as homeless shelters, nursing homes, and correctional facilities.  People who have been in contact with someone with TB.  People from countries where TB is more common. If you are in a high-risk group, your health care provider may wish to screen for TB more often. This can help prevent the spread of the disease. Sometimes TB screening is required when starting a new job, such as becoming a Public house manager or a Pharmacist, hospital. Colleges or universities may require it of new students. What is being tested? A tuberculin skin test is the main test used to check for exposure to the bacteria that can cause TB. The test checks for antibodies to the bacteria. Antibodies are proteins that your body produces to protect you from germs and  other things that can make you sick. Your health care provider will inject a solution known as PPD (purified protein derivative) under the first layer of skin on your arm. This causes a blister-like bubble to form at the site. Your health care provider will then examine the site after a number of hours have passed to see if a reaction has occurred. How do I prepare for this test? There is no preparation required for this test. What do the results mean? Your test results will be reported as either negative or positive. If the tuberculin skin test produces a negative result, it is likely that you do not have TB and have not been exposed to the TB bacteria. If you or your health care provider suspects exposure, however, you may want to repeat the test a few weeks later. A blood test may also be used to check for TB. This is because you will not react to the tuberculin skin test until several weeks after exposure to TB bacteria. If you test positive to the tuberculin skin test, it is likely that you have been exposed to TB bacteria. The test does not distinguish between an active and a latent TB infection. A false-positive result can occur. A false-positive result for TB bacteria is incorrect because it indicates a condition or finding is present when it is not. Talk to your health care provider to discuss your results, treatment options, and if necessary, the need for more tests. It is your responsibility to obtain your test  results. Ask the lab or department performing the test when and how you will get your results. Talk with your health care provider if you have any questions about your results. Talk with your health care provider to discuss your results, treatment options, and if necessary, the need for more tests. Talk with your health care provider if you have any questions about your results. This information is not intended to replace advice given to you by your health care provider. Make sure you  discuss any questions you have with your health care provider. Document Released: 04/28/2005 Document Revised: 03/21/2016 Document Reviewed: 11/12/2013 Elsevier Interactive Patient Education  2017 [employer].    IF you received an x-ray today, you will receive an invoice from [facility]. Please contact [facility] at [phone] with questions or concerns regarding your invoice.   IF you received labwork today, you will receive an invoice from [facility]. Please contact [facility] at [phone] with questions or concerns regarding your invoice.   Our billing staff will not be able to assist you with questions regarding bills from these companies.  You will be contacted with the lab results as soon as they are available. The fastest way to get your results is to activate your My Chart account. Instructions are located on the last page of this paperwork. If you have not heard from [place] regarding the results in 2 weeks, please contact this office.

## 2016-12-11 ENCOUNTER — Ambulatory Visit: Payer: 59 | Admitting: Emergency Medicine

## 2016-12-11 DIAGNOSIS — Z111 Encounter for screening for respiratory tuberculosis: Secondary | ICD-10-CM

## 2016-12-11 LAB — TB SKIN TEST
INDURATION: 0 mm
TB Skin Test: NEGATIVE

## 2016-12-11 NOTE — Progress Notes (Signed)
Read on 12/11/2016. 0 mm induration

## 2016-12-14 NOTE — Progress Notes (Signed)
Pt was not personally examined by me.

## 2016-12-22 ENCOUNTER — Other Ambulatory Visit: Payer: Self-pay | Admitting: Family Medicine

## 2016-12-22 DIAGNOSIS — Z1231 Encounter for screening mammogram for malignant neoplasm of breast: Secondary | ICD-10-CM

## 2017-01-25 NOTE — Progress Notes (Addendum)
[facility] [address], [place], [place] [address] [phone]  Date:  01/26/2017   Name:  Hailey Bryant   DOB:  1965-08-17   MRN:  [id number]  PCP:  Darreld Mclean, MD    Chief Complaint: Annual Exam (Pt here for CPE. )   History of Present Illness:  Hailey Bryant is a 51 y.o. very pleasant female patient who presents with the following:  Seen by myself a year ago for CPE  In the interim she had some rectal bleeding, underwent colonoscopy in February per Armbruster Pap 2016 negative for HPV- she does NOT have a cervix  Partial HPI from last year: S/p partial hyst; we did a pap with HPV last year that was normal She did have an abnormal pap in her 60s, ok since then She has one ovary left. She had a possible low grade cervical cancer 19 years ago, a partial hyst followed. She did not have radiation. She is not sure what if any follow-up she is currently supposed to have   Colonoscopy: 5 years ago.  Labs: last year, A1c of 5.7  Mammo scheduled today; her sister had bilateral breast cancer in her late 38s so she does screening annually.  Apparently she does not have a particular genetic mutation that increases her personal cancer risk  She did have a colonoscopy with polyp removal at age 10; never had any canceous polyps.  She had a repeat 5 years later, also negative.  Told to follow-up in 5 years. She is interested in cologuard testing She is fasting today for labs   She has gained some weight- has just gotten back to exercise and is trying to walk 3 miles outdoors in the morning.  She has a treadmill for rainy or cold days.  Feels that her diet is already quite good; she prefers healthful foods   Her sister who had the breast cancer is doing ok. She did lose another sister earlier this year to cancer as well, but she is not sure what type of cancer  She is fasting today except for a bit of creamer  today  She is not taking calcium/ vitamin D but would be willing to do so Married to [place]- they are planning a trip to [place] to see family this summer Overall she is feeling very well and has no real concerns No breast or skin concerns She will have her annual mammo soon Continues to eat a healthy diet but is not exercising as much as she was  BP Readings from Last 3 Encounters:  01/26/17 116/84  12/09/16 133/89  09/27/16 (!) 124/94   Wt Readings from Last 3 Encounters:  01/26/17 224 lb 9.6 oz (101.9 kg)  12/09/16 225 lb (102.1 kg)  09/27/16 229 lb (103.9 kg)    Patient Active Problem List   Diagnosis Date Noted  . Pre-diabetes 05/14/2015  . History of cervical cancer 02/26/2015  . Abnormal Pap smear of cervix 07/24/2014    Past Medical History:  Diagnosis Date  . Colon polyps     Past Surgical History:  Procedure Laterality Date  . ABDOMINAL HYSTERECTOMY    . KNEE SURGERY      Social History  Substance Use Topics  . Smoking status: Never Smoker  . Smokeless tobacco: Never Used  . Alcohol use No    Family History  Problem Relation Age of Onset  . Hypertension Mother   .  Cancer Sister   . Breast cancer Sister     No Known Allergies  Medication list has been reviewed and updated.  No current outpatient prescriptions on file prior to visit.   No current facility-administered medications on file prior to visit.     Review of Systems:  As per HPI- otherwise negative.   Physical Examination: Vitals:   01/26/17 1040  BP: 116/84  Pulse: 71  Temp: 98.8 F (37.1 C)   Vitals:   01/26/17 1040  Weight: 224 lb 9.6 oz (101.9 kg)  Height: 5\' 7"  (1.702 m)   Body mass index is 35.18 kg/m. Ideal Body Weight: Weight in (lb) to have BMI = 25: 159.3  GEN: WDWN, NAD, Non-toxic, A & O x 3, looks well, overweight HEENT: Atraumatic, Normocephalic. Neck supple. No masses, No LAD.  Bilateral TM wnl, oropharynx normal.  PEERL,EOMI.   Ears and Nose: No external  deformity. CV: RRR, No M/G/R. No JVD. No thrill. No extra heart sounds. PULM: CTA B, no wheezes, crackles, rhonchi. No retractions. No resp. distress. No accessory muscle use. ABD: S, NT, ND, +BS. No rebound. No HSM. EXTR: No c/c/e NEURO Normal gait.  PSYCH: Normally interactive. Conversant. Not depressed or anxious appearing.  Calm demeanor.    Assessment and Plan: Physical exam  Screening for deficiency anemia - Plan: CBC  Screening for hyperlipidemia - Plan: Lipid panel  Overweight  Pre-diabetes - Plan: Comprehensive metabolic panel, Hemoglobin A1c  Here today for a CPE Recommend calcium and vitamin D supplementation She will have her mammo soon Will plan further follow- up pending labs. BP looks fine She has lost a few lbs- encourage continued diet and exercise change for weight loss   Signed Lamar Blinks, MD  Received her labs  Results for orders placed or performed in visit on 01/26/17  CBC  Result Value Ref Range   WBC 4.7 4.0 - 10.5 K/uL   RBC 4.61 3.87 - 5.11 Mil/uL   Platelets 300.0 150.0 - 400.0 K/uL   Hemoglobin 13.2 12.0 - 15.0 g/dL   HCT 39.3 36.0 - 46.0 %   MCV 85.1 78.0 - 100.0 fl   MCHC 33.6 30.0 - 36.0 g/dL   RDW 14.0 11.5 - 15.5 %  Comprehensive metabolic panel  Result Value Ref Range   Sodium 136 135 - 145 mEq/L   Potassium 4.2 3.5 - 5.1 mEq/L   Chloride 102 96 - 112 mEq/L   CO2 29 19 - 32 mEq/L   Glucose, Bld 93 70 - 99 mg/dL   BUN 17 6 - 23 mg/dL   Creatinine, Ser 0.75 0.40 - 1.20 mg/dL   Total Bilirubin 0.5 0.2 - 1.2 mg/dL   Alkaline Phosphatase 50 39 - 117 U/L   AST 15 0 - 37 U/L   ALT 11 0 - 35 U/L   Total Protein 7.5 6.0 - 8.3 g/dL   Albumin 4.1 3.5 - 5.2 g/dL   Calcium 9.7 8.4 - 10.5 mg/dL   GFR 104.32 >60.00 mL/min  Hemoglobin A1c  Result Value Ref Range   Hgb A1c MFr Bld 5.8 4.6 - 6.5 %  Lipid panel  Result Value Ref Range   Cholesterol 260 (H) 0 - 200 mg/dL   Triglycerides 72.0 0.0 - 149.0 mg/dL   HDL 82.10 >39.00  mg/dL   VLDL 14.4 0.0 - 40.0 mg/dL   LDL Cholesterol 164 (H) 0 - 99 mg/dL   Total CHOL/HDL Ratio 3    NonHDL 178.18

## 2017-01-26 ENCOUNTER — Encounter: Payer: Self-pay | Admitting: Family Medicine

## 2017-01-26 ENCOUNTER — Ambulatory Visit (INDEPENDENT_AMBULATORY_CARE_PROVIDER_SITE_OTHER): Payer: 59 | Admitting: Family Medicine

## 2017-01-26 VITALS — BP 116/84 | HR 71 | Temp 98.8°F | Ht 67.0 in | Wt 224.6 lb

## 2017-01-26 DIAGNOSIS — Z130 Encounter for screening for diseases of the blood and blood-forming organs and certain disorders involving the immune mechanism: Secondary | ICD-10-CM | POA: Diagnosis not present

## 2017-01-26 DIAGNOSIS — E663 Overweight: Secondary | ICD-10-CM

## 2017-01-26 DIAGNOSIS — Z13220 Encounter for screening for lipoid disorders: Secondary | ICD-10-CM | POA: Diagnosis not present

## 2017-01-26 DIAGNOSIS — Z0000 Encounter for general adult medical examination without abnormal findings: Secondary | ICD-10-CM | POA: Diagnosis not present

## 2017-01-26 DIAGNOSIS — R7303 Prediabetes: Secondary | ICD-10-CM | POA: Diagnosis not present

## 2017-01-26 LAB — COMPREHENSIVE METABOLIC PANEL
ALBUMIN: 4.1 g/dL (ref 3.5–5.2)
ALK PHOS: 50 U/L (ref 39–117)
ALT: 11 U/L (ref 0–35)
AST: 15 U/L (ref 0–37)
BUN: 17 mg/dL (ref 6–23)
CO2: 29 mEq/L (ref 19–32)
CREATININE: 0.75 mg/dL (ref 0.40–1.20)
Calcium: 9.7 mg/dL (ref 8.4–10.5)
Chloride: 102 mEq/L (ref 96–112)
GFR: 104.32 mL/min (ref >60.00)
Glucose, Bld: 93 mg/dL (ref 70–99)
Potassium: 4.2 mEq/L (ref 3.5–5.1)
SODIUM: 136 meq/L (ref 135–145)
TOTAL PROTEIN: 7.5 g/dL (ref 6.0–8.3)
Total Bilirubin: 0.5 mg/dL (ref 0.2–1.2)

## 2017-01-26 LAB — CBC
HCT: 39.3 % (ref 36.0–46.0)
Hemoglobin: 13.2 g/dL (ref 12.0–15.0)
MCHC: 33.6 g/dL (ref 30.0–36.0)
MCV: 85.1 fl (ref 78.0–100.0)
Platelets: 300 10*3/uL (ref 150.0–400.0)
RBC: 4.61 Mil/uL (ref 3.87–5.11)
RDW: 14 % (ref 11.5–15.5)
WBC: 4.7 10*3/uL (ref 4.0–10.5)

## 2017-01-26 LAB — LIPID PANEL
CHOL/HDL RATIO: 3
Cholesterol: 260 mg/dL — ABNORMAL HIGH (ref 0–200)
HDL: 82.1 mg/dL (ref >39.00)
LDL Cholesterol: 164 mg/dL — ABNORMAL HIGH (ref 0–99)
NONHDL: 178.18
Triglycerides: 72 mg/dL (ref 0.0–149.0)
VLDL: 14.4 mg/dL (ref 0.0–40.0)

## 2017-01-26 LAB — HEMOGLOBIN A1C: HEMOGLOBIN A1C: 5.8 % (ref 4.6–6.5)

## 2017-01-26 NOTE — Patient Instructions (Signed)
Aim to get 1200 mg of calcium and 1,000 IU of vitamin D daily- a dietary supplement may be a good idea  I will be in touch with your labs asap Take care and let me know if you have any concerns.  Have a wonderful summer

## 2017-04-18 ENCOUNTER — Ambulatory Visit (HOSPITAL_BASED_OUTPATIENT_CLINIC_OR_DEPARTMENT_OTHER)
Admission: RE | Admit: 2017-04-18 | Discharge: 2017-04-18 | Disposition: A | Discharge status: Home / Self Care | Payer: 59 | Source: Ambulatory Visit | Attending: Family Medicine | Admitting: Family Medicine

## 2017-04-18 DIAGNOSIS — Z1231 Encounter for screening mammogram for malignant neoplasm of breast: Secondary | ICD-10-CM | POA: Diagnosis present

## 2017-05-20 ENCOUNTER — Ambulatory Visit (INDEPENDENT_AMBULATORY_CARE_PROVIDER_SITE_OTHER): Payer: 59 | Admitting: Emergency Medicine

## 2017-05-20 DIAGNOSIS — Z23 Encounter for immunization: Secondary | ICD-10-CM

## 2017-06-21 ENCOUNTER — Encounter (HOSPITAL_COMMUNITY): Payer: Self-pay | Admitting: Emergency Medicine

## 2017-06-21 ENCOUNTER — Ambulatory Visit (HOSPITAL_COMMUNITY)
Admission: EM | Admit: 2017-06-21 | Discharge: 2017-06-21 | Disposition: A | Discharge status: Home / Self Care | Payer: 59 | Attending: Family Medicine | Admitting: Family Medicine

## 2017-06-21 DIAGNOSIS — M5431 Sciatica, right side: Secondary | ICD-10-CM | POA: Diagnosis not present

## 2017-06-21 MED ORDER — PREDNISONE 20 MG PO TABS: ORAL_TABLET | ORAL | 0 refills | Status: DC

## 2017-06-21 MED ORDER — GABAPENTIN 300 MG PO CAPS: 300.0000 mg | ORAL_CAPSULE | Freq: Every day | ORAL | 0 refills | Status: DC

## 2017-06-21 NOTE — Discharge Instructions (Signed)
If not improving, contact Dr. Lorelei Pont

## 2017-06-21 NOTE — ED Triage Notes (Signed)
PT C/O: right leg pain that begins at hip and radiates down the leg  ONSET: 1 month  SX INCLUDE: also reports some tingly sensation   DENIES: inj/trauma   TAKING MEDS: OTC Aleve w/temp relief.   A&O x4... NAD... Ambulatory

## 2017-06-21 NOTE — ED Provider Notes (Signed)
[place]   [id number] 06/21/17 Arrival Time: 6644   SUBJECTIVE:  Hailey Bryant is a 51 y.o. female who presents to the urgent care with complaint of right leg pain from the hip down for the last month or two.  Worsening.  Unrelieved by Aleve No trauma or weakness, no prior problem like this, no back pain  Works at [employer] and has a sit down job.  No physical hobbies.    Past Medical History:  Diagnosis Date  . Colon polyps    Family History  Problem Relation Age of Onset  . Hypertension Mother   . Cancer Sister   . Breast cancer Sister    Social History   Socioeconomic History  . Marital status: Married    Spouse name: Not on file  . Number of children: Not on file  . Years of education: Not on file  . Highest education level: Not on file  Social Needs  . Financial resource strain: Not on file  . Food insecurity - worry: Not on file  . Food insecurity - inability: Not on file  . Transportation needs - medical: Not on file  . Transportation needs - non-medical: Not on file  Occupational History  . Occupation: Therapist, art  Tobacco Use  . Smoking status: Never Smoker  . Smokeless tobacco: Never Used  Substance and Sexual Activity  . Alcohol use: No    Alcohol/week: 0.0 oz  . Drug use: No  . Sexual activity: Not on file  Other Topics Concern  . Not on file  Social History Narrative  . Not on file   No outpatient medications have been marked as taking for the 06/21/17 encounter [facility] Encounter).   No Known Allergies    ROS: As per HPI, remainder of ROS negative.   OBJECTIVE:   Vitals:   06/21/17 1332  BP: 127/82  Pulse: 75  Resp: 18  Temp: 98.8 F (37.1 C)  TempSrc: Oral  SpO2: 97%     General appearance: alert; no distress Eyes: PERRL; EOMI; conjunctiva normal HENT: normocephalic; atraumatic;external ears normal without trauma; nasal mucosa normal; oral mucosa normal Neck: supple Abdomen: soft, non-tender;  bowel sounds normal; no masses or organomegaly; no guarding or rebound tenderness Back: no CVA tenderness Extremities: no cyanosis or edema; symmetrical with no gross deformities SLR and femoral stretch tests negative, nontender right hip Skin: warm and dry Neurologic: normal gait; grossly normal Psychological: alert and cooperative; normal mood and affect      Labs:  Results for orders placed or performed in visit on 01/26/17  CBC  Result Value Ref Range   WBC 4.7 4.0 - 10.5 K/uL   RBC 4.61 3.87 - 5.11 Mil/uL   Platelets 300.0 150.0 - 400.0 K/uL   Hemoglobin 13.2 12.0 - 15.0 g/dL   HCT 39.3 36.0 - 46.0 %   MCV 85.1 78.0 - 100.0 fl   MCHC 33.6 30.0 - 36.0 g/dL   RDW 14.0 11.5 - 15.5 %  Comprehensive metabolic panel  Result Value Ref Range   Sodium 136 135 - 145 mEq/L   Potassium 4.2 3.5 - 5.1 mEq/L   Chloride 102 96 - 112 mEq/L   CO2 29 19 - 32 mEq/L   Glucose, Bld 93 70 - 99 mg/dL   BUN 17 6 - 23 mg/dL   Creatinine, Ser 0.75 0.40 - 1.20 mg/dL   Total Bilirubin 0.5 0.2 - 1.2 mg/dL   Alkaline Phosphatase 50 39 - 117 U/L  AST 15 0 - 37 U/L   ALT 11 0 - 35 U/L   Total Protein 7.5 6.0 - 8.3 g/dL   Albumin 4.1 3.5 - 5.2 g/dL   Calcium 9.7 8.4 - 10.5 mg/dL   GFR 104.32 >60.00 mL/min  Hemoglobin A1c  Result Value Ref Range   Hgb A1c MFr Bld 5.8 4.6 - 6.5 %  Lipid panel  Result Value Ref Range   Cholesterol 260 (H) 0 - 200 mg/dL   Triglycerides 72.0 0.0 - 149.0 mg/dL   HDL 82.10 >39.00 mg/dL   VLDL 14.4 0.0 - 40.0 mg/dL   LDL Cholesterol 164 (H) 0 - 99 mg/dL   Total CHOL/HDL Ratio 3    NonHDL 178.18     Labs Reviewed - No data to display  No results found.     ASSESSMENT & PLAN:  1. Sciatica of right side     Meds ordered this encounter  Medications  . predniSONE (DELTASONE) 20 MG tablet    Sig: Two daily with food    Dispense:  10 tablet    Refill:  0  . gabapentin (NEURONTIN) 300 MG capsule    Sig: Take 1 capsule (300 mg total) by mouth at  bedtime.    Dispense:  15 capsule    Refill:  0    Reviewed expectations re: course of current medical issues. Questions answered. Outlined signs and symptoms indicating need for more acute intervention. Patient verbalized understanding. After Visit Summary given.      Robyn Haber, MD 06/21/17 1345

## 2017-11-21 ENCOUNTER — Ambulatory Visit: Payer: 59 | Admitting: Family Medicine

## 2017-11-21 NOTE — Progress Notes (Signed)
[place] at [facility] [address] [place], [place] [address] [phone]  Date:  11/23/2017   Name:  Hailey Bryant   DOB:  [birth year]   MRN:  [id number]  PCP:  Darreld Mclean, MD    Chief Complaint: Leg Pain (c/o pain in right leg and calf that has been present for about 6 months. )   History of Present Illness:  Hailey Bryant is a 52 y.o. very pleasant female patient who presents with the following:  History of pre-diabetes and cervical cancer s/p hysterectomy. I saw her for a CPE last June  Here today with concern of calf pain- right She did visit the UC back in November with the same issue, saw Dr. Joseph Art - dx with sciatica and treated with prednisone and Neurontin The meds did help temporarily, but did not resolve her sx long term Her symptoms returned about 3 months ago She feels "like someone is pulling my vein out." she notes a pain and pulling feeling in her right leg on the lateral knee  Feels like she wants to slap or shake her leg to make it go away. She got a standing desk at work and this does help some- sitting is the worst for her.  She does not have to drive that much so is not sure if she has pain with driving No back pain, but she notes the pain running from her lower buttock down the lateral right leg.  It stops in the calf The left side is not involved.   No weakness or numbness of the leg, no swelling on the right side This issue just came up over the last year or so  She is using some tylenol and aleve at home, but does not want to depend on these drugs  She feels ok when she lays down- laying flat is helpful to her  She is otherwise feeling well today  Patient Active Problem List   Diagnosis Date Noted  . Pre-diabetes 05/14/2015  . History of cervical cancer 02/26/2015  . Abnormal Pap smear of cervix 07/24/2014    Past Medical History:  Diagnosis Date  . Colon polyps     Past Surgical  History:  Procedure Laterality Date  . ABDOMINAL HYSTERECTOMY    . KNEE SURGERY      Social History   Tobacco Use  . Smoking status: Never Smoker  . Smokeless tobacco: Never Used  Substance Use Topics  . Alcohol use: No    Alcohol/week: 0.0 oz  . Drug use: No    Family History  Problem Relation Age of Onset  . Hypertension Mother   . Cancer Sister   . Breast cancer Sister     No Known Allergies  Medication list has been reviewed and updated.  No current outpatient medications on file prior to visit.   No current facility-administered medications on file prior to visit.     Review of Systems:  As per HPI- otherwise negative. No fever or chills No CP or SOB   Physical Examination: Vitals:   11/23/17 0844  BP: 116/79  Pulse: 65  Temp: 98.4 F (36.9 C)  SpO2: 98%   Vitals:   11/23/17 0844  Weight: 223 lb (101.2 kg)  Height: 5\' 7"  (1.702 m)   Body mass index is 34.93 kg/m. Ideal Body Weight: Weight in (lb) to have BMI = 25: 159.3  GEN: WDWN, NAD, Non-toxic, A & O x  3, obese, looks well  HEENT: Atraumatic, Normocephalic. Neck supple. No masses, No LAD. Ears and Nose: No external deformity. CV: RRR, No M/G/R. No JVD. No thrill. No extra heart sounds. PULM: CTA B, no wheezes, crackles, rhonchi. No retractions. No resp. distress. No accessory muscle use. EXTR: No c/c. She does have trace edema of the LEFT ankle likely due to venous insuf.  No tenderness of either calf  NEURO Normal gait.  PSYCH: Normally interactive. Conversant. Not depressed or anxious appearing.  Calm demeanor.  No tenderness in her spine to palpation and no pain over her right sciatic notch. She has very good spinal mobility and is able to touch her toes with palms to the floor.  Seated, she does not have any SLR tenderness and has normal BLE strength, sensation and DTR    Assessment and Plan: Right sided sciatica - Plan: DG Lumbar Spine Complete, gabapentin (NEURONTIN) 300 MG capsule,  predniSONE (DELTASONE) 20 MG tablet  Here today with right sided sciatica sx, recurrent, over about one year.  Will treat her again with a prednisone burst and neurontin, and obtain L spine films today Assuming her films look ok plan a referral to sports med    Signed Lamar Blinks, MD  Received films, message to pt: You do have arthritis in your spine, but nothing out of the expected.  I will place a referral for you to sports med, they may be able to help with your sciatica.  Dg Lumbar Spine Complete  Result Date: 11/23/2017 CLINICAL DATA:  Chronic low back pain without known injury with right radiculopathy. EXAM: LUMBAR SPINE - COMPLETE 4+ VIEW COMPARISON:  None. FINDINGS: No fracture or spondylolisthesis is noted. Moderate degenerative disc disease is noted at T12-L1. Remaining disc spaces are intact. Posterior facet joints are unremarkable. IMPRESSION: Moderate degenerative disc disease is noted at T12-L1. No acute abnormality seen in the lumbar spine. Electronically Signed   By: Marijo Conception, M.D.   On: 11/23/2017 14:24

## 2017-11-23 ENCOUNTER — Encounter: Payer: Self-pay | Admitting: Family Medicine

## 2017-11-23 ENCOUNTER — Ambulatory Visit (INDEPENDENT_AMBULATORY_CARE_PROVIDER_SITE_OTHER): Payer: 59 | Admitting: Family Medicine

## 2017-11-23 ENCOUNTER — Ambulatory Visit (HOSPITAL_BASED_OUTPATIENT_CLINIC_OR_DEPARTMENT_OTHER)
Admission: RE | Admit: 2017-11-23 | Discharge: 2017-11-23 | Disposition: A | Discharge status: Home / Self Care | Payer: 59 | Source: Ambulatory Visit | Attending: Family Medicine | Admitting: Family Medicine

## 2017-11-23 ENCOUNTER — Other Ambulatory Visit (HOSPITAL_BASED_OUTPATIENT_CLINIC_OR_DEPARTMENT_OTHER): Payer: Self-pay | Admitting: Orthopedic Surgery

## 2017-11-23 VITALS — BP 116/79 | HR 65 | Temp 98.4°F | Ht 67.0 in | Wt 223.0 lb

## 2017-11-23 DIAGNOSIS — M5136 Other intervertebral disc degeneration, lumbar region: Secondary | ICD-10-CM | POA: Diagnosis not present

## 2017-11-23 DIAGNOSIS — M5431 Sciatica, right side: Secondary | ICD-10-CM | POA: Diagnosis not present

## 2017-11-23 MED ORDER — PREDNISONE 20 MG PO TABS: ORAL_TABLET | ORAL | 0 refills | Status: DC

## 2017-11-23 MED ORDER — GABAPENTIN 300 MG PO CAPS: 300.0000 mg | ORAL_CAPSULE | Freq: Every day | ORAL | 0 refills | Status: DC

## 2017-11-23 NOTE — Patient Instructions (Signed)
Good to see you today!  I am going to get an x-ray of your lower back today, and we will have you try another course of prednisone for 6 days You can take neurontin daily as needed - take at bedtime Assuming your films look ok I will refer you to sports med to look for any other longer term solutions for your pain

## 2017-12-18 NOTE — Progress Notes (Addendum)
[place] at [facility] [place], [place], [place] [address] [phone]  Date:  12/21/2017   Name:  Hailey Bryant   DOB:  1966/02/24   MRN:  [id number]  PCP:  Darreld Mclean, MD    Chief Complaint: Annual Exam (possible pap-up to pcp pt states, no concerns)   History of Present Illness:  Hailey Bryant is a 52 y.o. very pleasant female patient who presents with the following:  Here today for a CPE Last seen by myself in April for sciatica- this is pretty much resolved   Last CPE was in June of 2018: Pap 2016 negative for HPV- she does NOT have a cervix  Partial HPI from last year: S/p partial hyst; we did a pap with HPV last year that was normal She did have an abnormal pap in her 69s, ok since then She has one ovary left. She had a possible low grade cervical cancer 19 years ago, a partial hyst followed. She did not have radiation. She is not sure what if any follow-up she is currently supposed to have  Colonoscopy: 5 years ago.  Labs: last year, A1c of 5.7  Mammo scheduled today;her sister had bilateral breast cancer in her late 53sso she does screening annually. Apparently she does not have a particular genetic mutation that increases her personal cancer risk She did have a colonoscopy with polyp removal at age 22; never had any canceous polyps. She had a repeat 5 years later, also negative. Told to follow-up in 5 years. She is interested in cologuard testing She is fasting today for labs  She has gained some weight- has just gotten back to exercise and is trying to walk 3 miles outdoors in the morning. She has a treadmill for rainy or cold days. Feels that her diet is already quite good; she prefers healthful foods  Her sister who had the breast cancer is doing ok. She did lose another sister earlier this year to cancer as well, but she is not sure what type of cancer  Labs; she is fasting today Wt Readings  from Last 3 Encounters:  12/21/17 219 lb (99.3 kg)  11/23/17 223 lb (101.2 kg)  01/26/17 224 lb 9.6 oz (101.9 kg)   She has lost a few lbs She is exercising more- they are mowing lawns in the neighborhood for exercise which is great  No CP or SOB She is overall doing well and feeling great Her youngest child is a Therapist, art in college- her other 2 children are grown and also doing well She is married to [place]- they are doing a weight loss and fitness challenge together   She has her left ovary, otherwise s/p hyst. Due to possible history of cervical cancer we still do periodic pap of the vaginal cuff for her Patient Active Problem List   Diagnosis Date Noted  . Pre-diabetes 05/14/2015  . History of cervical cancer 02/26/2015  . Abnormal Pap smear of cervix 07/24/2014    Past Medical History:  Diagnosis Date  . Colon polyps     Past Surgical History:  Procedure Laterality Date  . ABDOMINAL HYSTERECTOMY    . KNEE SURGERY      Social History   Tobacco Use  . Smoking status: Never Smoker  . Smokeless tobacco: Never Used  Substance Use Topics  . Alcohol use: No    Alcohol/week: 0.0 oz  . Drug use: No    Family  History  Problem Relation Age of Onset  . Hypertension Mother   . Cancer Sister   . Breast cancer Sister     No Known Allergies  Medication list has been reviewed and updated.  Current Outpatient Medications on File Prior to Visit  Medication Sig Dispense Refill  . gabapentin (NEURONTIN) 300 MG capsule Take 1 capsule (300 mg total) by mouth at bedtime. 30 capsule 0   No current facility-administered medications on file prior to visit.     Review of Systems:  As per HPI- otherwise negative. No CP or SOB with exertion No nausea or vomiting Her mood is good Overall she feels healthy and happy   Physical Examination: Vitals:   12/21/17 0832  BP: 102/70  Pulse: 74  Resp: 16  Temp: 98.2 F (36.8 C)  SpO2: 100%   Vitals:   12/21/17 0832   Weight: 219 lb (99.3 kg)  Height: 5\' 7"  (1.702 m)   Body mass index is 34.3 kg/m. Ideal Body Weight: Weight in (lb) to have BMI = 25: 159.3  GEN: WDWN, NAD, Non-toxic, A & O x 3, overweight, looks well  HEENT: Atraumatic, Normocephalic. Neck supple. No masses, No LAD.  Bilateral TM wnl, oropharynx normal.  PEERL,EOMI.   Ears and Nose: No external deformity. CV: RRR, No M/G/R. No JVD. No thrill. No extra heart sounds. PULM: CTA B, no wheezes, crackles, rhonchi. No retractions. No resp. distress. No accessory muscle use. ABD: S, NT, ND, +BS. No rebound. No HSM. EXTR: No c/c/e NEURO Normal gait.  PSYCH: Normally interactive. Conversant. Not depressed or anxious appearing.  Calm demeanor.  Pelvic: normal s/p hyst, no vaginal lesions or discharge. Performed vaginal pap and HPV today    Assessment and Plan: Physical exam  Screening for hyperlipidemia - Plan: Lipid panel  Pre-diabetes - Plan: Comprehensive metabolic panel, Hemoglobin A1c  History of cervical cancer - Plan: Cytology - PAP  Screening for deficiency anemia - Plan: CBC CPE and pap today- Will plan further follow- up pending labs.    Signed Lamar Blinks, MD  Received labs so far Message to pt Just waiting on your pap Blood counts are normal Metabolic profile normal A6T is just at the pre-diabetes cut off this time- continue to monitor Your total to HDL cholesterol ratio is good, although your LDL is a bit high.  It is possible that a cholesterol mediation would help prevent cardiovascular disease for you later on.  Is this something you would be interested in taking? Results for orders placed or performed in visit on 12/21/17  CBC  Result Value Ref Range   WBC 4.9 4.0 - 10.5 K/uL   RBC 4.56 3.87 - 5.11 Mil/uL   Platelets 330.0 150.0 - 400.0 K/uL   Hemoglobin 13.2 12.0 - 15.0 g/dL   HCT 39.6 36.0 - 46.0 %   MCV 86.8 78.0 - 100.0 fl   MCHC 33.3 30.0 - 36.0 g/dL   RDW 13.9 11.5 - 15.5 %  Comprehensive  metabolic panel  Result Value Ref Range   Sodium 139 135 - 145 mEq/L   Potassium 4.8 3.5 - 5.1 mEq/L   Chloride 103 96 - 112 mEq/L   CO2 31 19 - 32 mEq/L   Glucose, Bld 94 70 - 99 mg/dL   BUN 13 6 - 23 mg/dL   Creatinine, Ser 0.84 0.40 - 1.20 mg/dL   Total Bilirubin 0.7 0.2 - 1.2 mg/dL   Alkaline Phosphatase 61 39 - 117 U/L   AST 11  0 - 37 U/L   ALT 6 0 - 35 U/L   Total Protein 7.3 6.0 - 8.3 g/dL   Albumin 4.0 3.5 - 5.2 g/dL   Calcium 9.7 8.4 - 10.5 mg/dL   GFR 91.21 >60.00 mL/min  Hemoglobin A1c  Result Value Ref Range   Hgb A1c MFr Bld 5.7 4.6 - 6.5 %  Lipid panel  Result Value Ref Range   Cholesterol 248 (H) 0 - 200 mg/dL   Triglycerides 65.0 0.0 - 149.0 mg/dL   HDL 82.10 >39.00 mg/dL   VLDL 13.0 0.0 - 40.0 mg/dL   LDL Cholesterol 153 (H) 0 - 99 mg/dL   Total CHOL/HDL Ratio 3    NonHDL 165.77    Received pap 5/23-  Message to pt

## 2017-12-20 ENCOUNTER — Ambulatory Visit: Payer: 59 | Admitting: Family Medicine

## 2017-12-21 ENCOUNTER — Ambulatory Visit (INDEPENDENT_AMBULATORY_CARE_PROVIDER_SITE_OTHER): Payer: 59 | Admitting: Family Medicine

## 2017-12-21 ENCOUNTER — Other Ambulatory Visit (HOSPITAL_COMMUNITY)
Admission: RE | Admit: 2017-12-21 | Discharge: 2017-12-21 | Disposition: A | Discharge status: Home / Self Care | Payer: 59 | Source: Ambulatory Visit | Attending: Family Medicine | Admitting: Family Medicine

## 2017-12-21 ENCOUNTER — Encounter: Payer: Self-pay | Admitting: Family Medicine

## 2017-12-21 ENCOUNTER — Other Ambulatory Visit: Payer: Self-pay | Admitting: Family Medicine

## 2017-12-21 VITALS — BP 102/70 | HR 74 | Temp 98.2°F | Resp 16 | Ht 67.0 in | Wt 219.0 lb

## 2017-12-21 DIAGNOSIS — Z1231 Encounter for screening mammogram for malignant neoplasm of breast: Secondary | ICD-10-CM

## 2017-12-21 DIAGNOSIS — Z8541 Personal history of malignant neoplasm of cervix uteri: Secondary | ICD-10-CM | POA: Diagnosis present

## 2017-12-21 DIAGNOSIS — M5431 Sciatica, right side: Secondary | ICD-10-CM

## 2017-12-21 DIAGNOSIS — R7303 Prediabetes: Secondary | ICD-10-CM | POA: Diagnosis not present

## 2017-12-21 DIAGNOSIS — Z130 Encounter for screening for diseases of the blood and blood-forming organs and certain disorders involving the immune mechanism: Secondary | ICD-10-CM

## 2017-12-21 DIAGNOSIS — Z13220 Encounter for screening for lipoid disorders: Secondary | ICD-10-CM | POA: Diagnosis not present

## 2017-12-21 DIAGNOSIS — Z0000 Encounter for general adult medical examination without abnormal findings: Secondary | ICD-10-CM

## 2017-12-21 LAB — CBC
HEMATOCRIT: 39.6 % (ref 36.0–46.0)
Hemoglobin: 13.2 g/dL (ref 12.0–15.0)
MCHC: 33.3 g/dL (ref 30.0–36.0)
MCV: 86.8 fl (ref 78.0–100.0)
Platelets: 330 10*3/uL (ref 150.0–400.0)
RBC: 4.56 Mil/uL (ref 3.87–5.11)
RDW: 13.9 % (ref 11.5–15.5)
WBC: 4.9 10*3/uL (ref 4.0–10.5)

## 2017-12-21 LAB — COMPREHENSIVE METABOLIC PANEL
ALBUMIN: 4 g/dL (ref 3.5–5.2)
ALK PHOS: 61 U/L (ref 39–117)
ALT: 6 U/L (ref 0–35)
AST: 11 U/L (ref 0–37)
BUN: 13 mg/dL (ref 6–23)
CO2: 31 mEq/L (ref 19–32)
CREATININE: 0.84 mg/dL (ref 0.40–1.20)
Calcium: 9.7 mg/dL (ref 8.4–10.5)
Chloride: 103 mEq/L (ref 96–112)
GFR: 91.21 mL/min (ref >60.00)
GLUCOSE: 94 mg/dL (ref 70–99)
POTASSIUM: 4.8 meq/L (ref 3.5–5.1)
SODIUM: 139 meq/L (ref 135–145)
Total Bilirubin: 0.7 mg/dL (ref 0.2–1.2)
Total Protein: 7.3 g/dL (ref 6.0–8.3)

## 2017-12-21 LAB — HEMOGLOBIN A1C: HEMOGLOBIN A1C: 5.7 % (ref 4.6–6.5)

## 2017-12-21 LAB — LIPID PANEL
Cholesterol: 248 mg/dL — ABNORMAL HIGH (ref 0–200)
HDL: 82.1 mg/dL (ref >39.00)
LDL Cholesterol: 153 mg/dL — ABNORMAL HIGH (ref 0–99)
NonHDL: 165.77
Total CHOL/HDL Ratio: 3
Triglycerides: 65 mg/dL (ref 0.0–149.0)
VLDL: 13 mg/dL (ref 0.0–40.0)

## 2017-12-21 NOTE — Patient Instructions (Signed)
Great to see you as always!  I will be in touch with your labs and pap Continue regular exercise and work on your weight loss- great job so far!  Health Maintenance, Female Adopting a healthy lifestyle and getting preventive care can go a long way to promote health and wellness. Talk with your health care provider about what schedule of regular examinations is right for you. This is a good chance for you to check in with your provider about disease prevention and staying healthy. In between checkups, there are plenty of things you can do on your own. Experts have done a lot of research about which lifestyle changes and preventive measures are most likely to keep you healthy. Ask your health care provider for more information. Weight and diet Eat a healthy diet  Be sure to include plenty of vegetables, fruits, low-fat dairy products, and lean protein.  Do not eat a lot of foods high in solid fats, added sugars, or salt.  Get regular exercise. This is one of the most important things you can do for your health. ? Most adults should exercise for at least 150 minutes each week. The exercise should increase your heart rate and make you sweat (moderate-intensity exercise). ? Most adults should also do strengthening exercises at least twice a week. This is in addition to the moderate-intensity exercise.  Maintain a healthy weight  Body mass index (BMI) is a measurement that can be used to identify possible weight problems. It estimates body fat based on height and weight. Your health care provider can help determine your BMI and help you achieve or maintain a healthy weight.  For females 11 years of age and older: ? A BMI below 18.5 is considered underweight. ? A BMI of 18.5 to 24.9 is normal. ? A BMI of 25 to 29.9 is considered overweight. ? A BMI of 30 and above is considered obese.  Watch levels of cholesterol and blood lipids  You should start having your blood tested for lipids and  cholesterol at 53 years of age, then have this test every 5 years.  You may need to have your cholesterol levels checked more often if: ? Your lipid or cholesterol levels are high. ? You are older than 53 years of age. ? You are at high risk for heart disease.  Cancer screening Lung Cancer  Lung cancer screening is recommended for adults 53-1 years old who are at high risk for lung cancer because of a history of smoking.  A yearly low-dose CT scan of the lungs is recommended for people who: ? Currently smoke. ? Have quit within the past 15 years. ? Have at least a 30-pack-year history of smoking. A pack year is smoking an average of one pack of cigarettes a day for 1 year.  Yearly screening should continue until it has been 15 years since you quit.  Yearly screening should stop if you develop a health problem that would prevent you from having lung cancer treatment.  Breast Cancer  Practice breast self-awareness. This means understanding how your breasts normally appear and feel.  It also means doing regular breast self-exams. Let your health care provider know about any changes, no matter how small.  If you are in your 53 or 30s, you should have a clinical breast exam (CBE) by a health care provider every 1-3 years as part of a regular health exam.  If you are 53 or older, have a CBE every year. Also consider having a  breast X-ray (mammogram) every year.  If you have a family history of breast cancer, talk to your health care provider about genetic screening.  If you are at high risk for breast cancer, talk to your health care provider about having an MRI and a mammogram every year.  Breast cancer gene (BRCA) assessment is recommended for women who have family members with BRCA-related cancers. BRCA-related cancers include: ? Breast. ? Ovarian. ? Tubal. ? Peritoneal cancers.  Results of the assessment will determine the need for genetic counseling and BRCA1 and BRCA2  testing.  Cervical Cancer Your health care provider may recommend that you be screened regularly for cancer of the pelvic organs (ovaries, uterus, and vagina). This screening involves a pelvic examination, including checking for microscopic changes to the surface of your cervix (Pap test). You may be encouraged to have this screening done every 3 years, beginning at age 53.  For women ages 53-65, health care providers may recommend pelvic exams and Pap testing every 3 years, or they may recommend the Pap and pelvic exam, combined with testing for human papilloma virus (HPV), every 5 years. Some types of HPV increase your risk of cervical cancer. Testing for HPV may also be done on women of any age with unclear Pap test results.  Other health care providers may not recommend any screening for nonpregnant women who are considered low risk for pelvic cancer and who do not have symptoms. Ask your health care provider if a screening pelvic exam is right for you.  If you have had past treatment for cervical cancer or a condition that could lead to cancer, you need Pap tests and screening for cancer for at least 20 years after your treatment. If Pap tests have been discontinued, your risk factors (such as having a new sexual partner) need to be reassessed to determine if screening should resume. Some women have medical problems that increase the chance of getting cervical cancer. In these cases, your health care provider may recommend more frequent screening and Pap tests.  Colorectal Cancer  This type of cancer can be detected and often prevented.  Routine colorectal cancer screening usually begins at 53 years of age and continues through 53 years of age.  Your health care provider may recommend screening at an earlier age if you have risk factors for colon cancer.  Your health care provider may also recommend using home test kits to check for hidden blood in the stool.  A small camera at the end of a  tube can be used to examine your colon directly (sigmoidoscopy or colonoscopy). This is done to check for the earliest forms of colorectal cancer.  Routine screening usually begins at age 53.  Direct examination of the colon should be repeated every 5-10 years through 53 years of age. However, you may need to be screened more often if early forms of precancerous polyps or small growths are found.  Skin Cancer  Check your skin from head to toe regularly.  Tell your health care provider about any new moles or changes in moles, especially if there is a change in a mole's shape or color.  Also tell your health care provider if you have a mole that is larger than the size of a pencil eraser.  Always use sunscreen. Apply sunscreen liberally and repeatedly throughout the day.  Protect yourself by wearing long sleeves, pants, a wide-brimmed hat, and sunglasses whenever you are outside.  Heart disease, diabetes, and high blood pressure  High  blood pressure causes heart disease and increases the risk of stroke. High blood pressure is more likely to develop in: ? People who have blood pressure in the high end of the normal range (130-139/85-89 mm Hg). ? People who are overweight or obese. ? People who are African American.  If you are 80-3 years of age, have your blood pressure checked every 3-5 years. If you are 28 years of age or older, have your blood pressure checked every year. You should have your blood pressure measured twice-once when you are at a hospital or clinic, and once when you are not at a hospital or clinic. Record the average of the two measurements. To check your blood pressure when you are not at a hospital or clinic, you can use: ? An automated blood pressure machine at a pharmacy. ? A home blood pressure monitor.  If you are between 14 years and 15 years old, ask your health care provider if you should take aspirin to prevent strokes.  Have regular diabetes screenings. This  involves taking a blood sample to check your fasting blood sugar level. ? If you are at a normal weight and have a low risk for diabetes, have this test once every three years after 53 years of age. ? If you are overweight and have a high risk for diabetes, consider being tested at a younger age or more often. Preventing infection Hepatitis B  If you have a higher risk for hepatitis B, you should be screened for this virus. You are considered at high risk for hepatitis B if: ? You were born in a country where hepatitis B is common. Ask your health care provider which countries are considered high risk. ? Your parents were born in a high-risk country, and you have not been immunized against hepatitis B (hepatitis B vaccine). ? You have HIV or AIDS. ? You use needles to inject street drugs. ? You live with someone who has hepatitis B. ? You have had sex with someone who has hepatitis B. ? You get hemodialysis treatment. ? You take certain medicines for conditions, including cancer, organ transplantation, and autoimmune conditions.  Hepatitis C  Blood testing is recommended for: ? Everyone born from [birth year] through 1966. ? Anyone with known risk factors for hepatitis C.  Sexually transmitted infections (STIs)  You should be screened for sexually transmitted infections (STIs) including gonorrhea and chlamydia if: ? You are sexually active and are younger than 53 years of age. ? You are older than 53 years of age and your health care provider tells you that you are at risk for this type of infection. ? Your sexual activity has changed since you were last screened and you are at an increased risk for chlamydia or gonorrhea. Ask your health care provider if you are at risk.  If you do not have HIV, but are at risk, it may be recommended that you take a prescription medicine daily to prevent HIV infection. This is called pre-exposure prophylaxis (PrEP). You are considered at risk if: ? You are  sexually active and do not regularly use condoms or know the HIV status of your partner(s). ? You take drugs by injection. ? You are sexually active with a partner who has HIV.  Talk with your health care provider about whether you are at high risk of being infected with HIV. If you choose to begin PrEP, you should first be tested for HIV. You should then be tested every 3 months for  as long as you are taking PrEP. Pregnancy  If you are premenopausal and you may become pregnant, ask your health care provider about preconception counseling.  If you may become pregnant, take 400 to 800 micrograms (mcg) of folic acid every day.  If you want to prevent pregnancy, talk to your health care provider about birth control (contraception). Osteoporosis and menopause  Osteoporosis is a disease in which the bones lose minerals and strength with aging. This can result in serious bone fractures. Your risk for osteoporosis can be identified using a bone density scan.  If you are 70 years of age or older, or if you are at risk for osteoporosis and fractures, ask your health care provider if you should be screened.  Ask your health care provider whether you should take a calcium or vitamin D supplement to lower your risk for osteoporosis.  Menopause may have certain physical symptoms and risks.  Hormone replacement therapy may reduce some of these symptoms and risks. Talk to your health care provider about whether hormone replacement therapy is right for you. Follow these instructions at home:  Schedule regular health, dental, and eye exams.  Stay current with your immunizations.  Do not use any tobacco products including cigarettes, chewing tobacco, or electronic cigarettes.  If you are pregnant, do not drink alcohol.  If you are breastfeeding, limit how much and how often you drink alcohol.  Limit alcohol intake to no more than 1 drink per day for nonpregnant women. One drink equals 12 ounces of  beer, 5 ounces of wine, or 1 ounces of hard liquor.  Do not use street drugs.  Do not share needles.  Ask your health care provider for help if you need support or information about quitting drugs.  Tell your health care provider if you often feel depressed.  Tell your health care provider if you have ever been abused or do not feel safe at home. This information is not intended to replace advice given to you by your health care provider. Make sure you discuss any questions you have with your health care provider. Document Released: 02/01/2011 Document Revised: 12/25/2015 Document Reviewed: 04/22/2015 Elsevier Interactive Patient Education  [employer].

## 2017-12-22 ENCOUNTER — Encounter: Payer: Self-pay | Admitting: Family Medicine

## 2017-12-22 LAB — CYTOLOGY - PAP
Diagnosis: NEGATIVE
HPV (WINDOPATH): NOT DETECTED

## 2018-01-05 ENCOUNTER — Other Ambulatory Visit: Payer: Self-pay | Admitting: Family Medicine

## 2018-02-01 ENCOUNTER — Encounter: Payer: 59 | Admitting: Family Medicine

## 2018-02-11 ENCOUNTER — Other Ambulatory Visit: Payer: Self-pay | Admitting: Family Medicine

## 2018-02-11 DIAGNOSIS — M5431 Sciatica, right side: Secondary | ICD-10-CM

## 2018-02-13 ENCOUNTER — Encounter: Payer: Self-pay | Admitting: Family Medicine

## 2018-02-13 DIAGNOSIS — M5431 Sciatica, right side: Secondary | ICD-10-CM

## 2018-02-14 MED ORDER — PREDNISONE 20 MG PO TABS: ORAL_TABLET | ORAL | 0 refills | Status: DC

## 2018-03-31 ENCOUNTER — Inpatient Hospital Stay (HOSPITAL_BASED_OUTPATIENT_CLINIC_OR_DEPARTMENT_OTHER): Admission: RE | Admit: 2018-03-31 | Payer: 59 | Source: Ambulatory Visit

## 2018-04-24 ENCOUNTER — Ambulatory Visit (HOSPITAL_BASED_OUTPATIENT_CLINIC_OR_DEPARTMENT_OTHER)
Admission: RE | Admit: 2018-04-24 | Discharge: 2018-04-24 | Disposition: A | Discharge status: Home / Self Care | Payer: 59 | Source: Ambulatory Visit | Attending: Family Medicine | Admitting: Family Medicine

## 2018-04-24 DIAGNOSIS — Z1231 Encounter for screening mammogram for malignant neoplasm of breast: Secondary | ICD-10-CM | POA: Insufficient documentation

## 2018-12-19 ENCOUNTER — Other Ambulatory Visit: Payer: Self-pay | Admitting: Family Medicine

## 2018-12-19 DIAGNOSIS — M5431 Sciatica, right side: Secondary | ICD-10-CM

## 2019-01-13 NOTE — Progress Notes (Addendum)
[facility] [address], [place], [place] [address] [phone]  Date:  01/15/2019   Name:  Hailey Bryant   DOB:  1965/08/08   MRN:  [id number]  PCP:  Darreld Mclean, MD    Chief Complaint: Annual Exam   History of Present Illness:  Hailey Bryant is a 53 y.o. very pleasant female patient who presents with the following:  Here today for a CPE Last seen by myself in May of 2019-   Married, she has 3 adult kids.  Her youngest just graduated from college. She is trying to move to [place] in hopes to get a job in [employer].  Her other daughter is in [place] and her son is in [place]  S/p hyst with one ovary remaining.  We do periodic vaginal cuff pap for her due to history of cervical dysplasia.   Pap last year Mammo: 9/19 Colon:  2018- due next year, 3 year recall  Labs: one year ago - will do today  Immun:can suggest shingrix - she prefers to wait till after pandemic  Wt Readings from Last 3 Encounters:  01/15/19 213 lb (96.6 kg)  12/21/17 219 lb (99.3 kg)  11/23/17 223 lb (101.2 kg)   She enjoys walking 3-5 miles a day she is doing this several times a week.  She has lost some weight- about 10 lbs!  She would like to lose more, but I congratulated her on her strong efforts so far No CP or SOB   She has been taking 3 mg of gabapentin at bedtime for sciatica.  This seems to help, she would like to continue this medication  Patient Active Problem List   Diagnosis Date Noted  . Pre-diabetes 05/14/2015  . History of cervical cancer 02/26/2015  . Abnormal Pap smear of cervix 07/24/2014    Past Medical History:  Diagnosis Date  . Colon polyps     Past Surgical History:  Procedure Laterality Date  . ABDOMINAL HYSTERECTOMY    . KNEE SURGERY      Social History   Tobacco Use  . Smoking status: Never Smoker  . Smokeless tobacco: Never Used  Substance Use Topics  . Alcohol use: No   Alcohol/week: 0.0 standard drinks  . Drug use: No    Family History  Problem Relation Age of Onset  . Hypertension Mother   . Cancer Sister   . Breast cancer Sister     No Known Allergies  Medication list has been reviewed and updated.  No current outpatient medications on file prior to visit.   No current facility-administered medications on file prior to visit.     Review of Systems:  As per HPI- otherwise negative. No fever or chills, no chest pain or shortness of breath.  No breast changes  Physical Examination: Vitals:   01/15/19 1038  BP: 122/78  Pulse: 75  Resp: 16  Temp: 98.2 F (36.8 C)  SpO2: 97%   Vitals:   01/15/19 1038  Weight: 213 lb (96.6 kg)  Height: 5\' 7"  (1.702 m)   Body mass index is 33.36 kg/m. Ideal Body Weight: Weight in (lb) to have BMI = 25: 159.3  GEN: WDWN, NAD, Non-toxic, A & O x 3, overweight, looks well HEENT: Atraumatic, Normocephalic. Neck supple. No masses, No LAD.  TM wnl bilaterally Ears and Nose: No external deformity. CV: RRR, No M/G/R. No JVD. No thrill. No extra heart sounds. PULM:  CTA B, no wheezes, crackles, rhonchi. No retractions. No resp. distress. No accessory muscle use. ABD: S, NT, ND, +BS. No rebound. No HSM. EXTR: No c/c/e NEURO Normal gait.  PSYCH: Normally interactive. Conversant. Not depressed or anxious appearing.  Calm demeanor.    Assessment and Plan:   ICD-10-CM   1. Physical exam  Z00.00   2. Screening for hyperlipidemia  Z13.220 Lipid panel  3. Pre-diabetes  R73.03 Comprehensive metabolic panel    Hemoglobin A1c  4. Screening for deficiency anemia  Z13.0 CBC  5. Screening for HIV (human immunodeficiency virus)  Z11.4   6. Right sided sciatica  M54.31 gabapentin (NEURONTIN) 300 MG capsule   Here today for routine physical.  Labs pain as above, refilled her gabapentin to use for sciatica pain Health maintenance is up-to-date. Patient reports she was screened for HIV in the past Recommended that  she have the shingles vaccine at her convenience  Follow-up: No follow-ups on file.  Meds ordered this encounter  Medications  . gabapentin (NEURONTIN) 300 MG capsule    Sig: TAKE 1 CAPSULE BY MOUTH EVERYDAY AT BEDTIME(CALL INSURANCE TO OPT OUT OF MAILORDER)    Dispense:  90 capsule    Refill:  1   Orders Placed This Encounter  Procedures  . CBC  . Comprehensive metabolic panel  . Hemoglobin A1c  . Lipid panel    Signed Lamar Blinks, MD   Received her labs, message to pt  Results for orders placed or performed in visit on 01/15/19  CBC  Result Value Ref Range   WBC 3.7 (L) 4.0 - 10.5 K/uL   RBC 4.44 3.87 - 5.11 Mil/uL   Platelets 295.0 150.0 - 400.0 K/uL   Hemoglobin 12.7 12.0 - 15.0 g/dL   HCT 37.7 36.0 - 46.0 %   MCV 84.9 78.0 - 100.0 fl   MCHC 33.7 30.0 - 36.0 g/dL   RDW 14.1 11.5 - 15.5 %  Comprehensive metabolic panel  Result Value Ref Range   Sodium 136 135 - 145 mEq/L   Potassium 4.7 3.5 - 5.1 mEq/L   Chloride 102 96 - 112 mEq/L   CO2 27 19 - 32 mEq/L   Glucose, Bld 85 70 - 99 mg/dL   BUN 15 6 - 23 mg/dL   Creatinine, Ser 0.76 0.40 - 1.20 mg/dL   Total Bilirubin 0.7 0.2 - 1.2 mg/dL   Alkaline Phosphatase 59 39 - 117 U/L   AST 14 0 - 37 U/L   ALT 8 0 - 35 U/L   Total Protein 7.2 6.0 - 8.3 g/dL   Albumin 4.1 3.5 - 5.2 g/dL   Calcium 9.3 8.4 - 10.5 mg/dL   GFR 95.94 >60.00 mL/min  Hemoglobin A1c  Result Value Ref Range   Hgb A1c MFr Bld 5.9 4.6 - 6.5 %  Lipid panel  Result Value Ref Range   Cholesterol 236 (H) 0 - 200 mg/dL   Triglycerides 62.0 0.0 - 149.0 mg/dL   HDL 84.10 >39.00 mg/dL   VLDL 12.4 0.0 - 40.0 mg/dL   LDL Cholesterol 139 (H) 0 - 99 mg/dL   Total CHOL/HDL Ratio 3    NonHDL 151.49    The 10-year ASCVD risk score Mikey Bussing DC Jr., et al., 2013) is: 1.8%   Values used to calculate the score:     Age: 49 years     Sex: Female     Is Non-Hispanic African American: Yes     Diabetic: No     Tobacco  smoker: No     Systolic Blood  Pressure: 122 mmHg     Is BP treated: No     HDL Cholesterol: 84.1 mg/dL     Total Cholesterol: 236 mg/dL a1c stable in pre-diabetes range

## 2019-01-15 ENCOUNTER — Encounter: Payer: Self-pay | Admitting: Family Medicine

## 2019-01-15 ENCOUNTER — Ambulatory Visit (INDEPENDENT_AMBULATORY_CARE_PROVIDER_SITE_OTHER): Payer: 59 | Admitting: Family Medicine

## 2019-01-15 ENCOUNTER — Other Ambulatory Visit: Payer: Self-pay

## 2019-01-15 VITALS — BP 122/78 | HR 75 | Temp 98.2°F | Resp 16 | Ht 67.0 in | Wt 213.0 lb

## 2019-01-15 DIAGNOSIS — R7303 Prediabetes: Secondary | ICD-10-CM | POA: Diagnosis not present

## 2019-01-15 DIAGNOSIS — Z130 Encounter for screening for diseases of the blood and blood-forming organs and certain disorders involving the immune mechanism: Secondary | ICD-10-CM

## 2019-01-15 DIAGNOSIS — Z0000 Encounter for general adult medical examination without abnormal findings: Secondary | ICD-10-CM | POA: Diagnosis not present

## 2019-01-15 DIAGNOSIS — Z13220 Encounter for screening for lipoid disorders: Secondary | ICD-10-CM | POA: Diagnosis not present

## 2019-01-15 DIAGNOSIS — M5431 Sciatica, right side: Secondary | ICD-10-CM

## 2019-01-15 DIAGNOSIS — Z114 Encounter for screening for human immunodeficiency virus [HIV]: Secondary | ICD-10-CM

## 2019-01-15 LAB — LIPID PANEL
Cholesterol: 236 mg/dL — ABNORMAL HIGH (ref 0–200)
HDL: 84.1 mg/dL (ref >39.00)
LDL Cholesterol: 139 mg/dL — ABNORMAL HIGH (ref 0–99)
NonHDL: 151.49
Total CHOL/HDL Ratio: 3
Triglycerides: 62 mg/dL (ref 0.0–149.0)
VLDL: 12.4 mg/dL (ref 0.0–40.0)

## 2019-01-15 LAB — COMPREHENSIVE METABOLIC PANEL
ALT: 8 U/L (ref 0–35)
AST: 14 U/L (ref 0–37)
Albumin: 4.1 g/dL (ref 3.5–5.2)
Alkaline Phosphatase: 59 U/L (ref 39–117)
BUN: 15 mg/dL (ref 6–23)
CO2: 27 mEq/L (ref 19–32)
Calcium: 9.3 mg/dL (ref 8.4–10.5)
Chloride: 102 mEq/L (ref 96–112)
Creatinine, Ser: 0.76 mg/dL (ref 0.40–1.20)
GFR: 95.94 mL/min (ref >60.00)
Glucose, Bld: 85 mg/dL (ref 70–99)
Potassium: 4.7 mEq/L (ref 3.5–5.1)
Sodium: 136 mEq/L (ref 135–145)
Total Bilirubin: 0.7 mg/dL (ref 0.2–1.2)
Total Protein: 7.2 g/dL (ref 6.0–8.3)

## 2019-01-15 LAB — CBC
HCT: 37.7 % (ref 36.0–46.0)
Hemoglobin: 12.7 g/dL (ref 12.0–15.0)
MCHC: 33.7 g/dL (ref 30.0–36.0)
MCV: 84.9 fl (ref 78.0–100.0)
Platelets: 295 10*3/uL (ref 150.0–400.0)
RBC: 4.44 Mil/uL (ref 3.87–5.11)
RDW: 14.1 % (ref 11.5–15.5)
WBC: 3.7 10*3/uL — ABNORMAL LOW (ref 4.0–10.5)

## 2019-01-15 LAB — HEMOGLOBIN A1C: Hgb A1c MFr Bld: 5.9 % (ref 4.6–6.5)

## 2019-01-15 MED ORDER — GABAPENTIN 300 MG PO CAPS: ORAL_CAPSULE | ORAL | 1 refills | Status: DC

## 2019-01-15 NOTE — Patient Instructions (Signed)
Great to see you again today!  Take care and I will be in touch with your labs asap Continue the great work with exercise  We can do the Shingrix vaccine series for you at your convenience  Please do contact your GI providers office and check on when you need your next colonoscopy- I think you might be due next year   Health Maintenance, Female Adopting a healthy lifestyle and getting preventive care can go a long way to promote health and wellness. Talk with your health care provider about what schedule of regular examinations is right for you. This is a good chance for you to check in with your provider about disease prevention and staying healthy. In between checkups, there are plenty of things you can do on your own. Experts have done a lot of research about which lifestyle changes and preventive measures are most likely to keep you healthy. Ask your health care provider for more information. Weight and diet Eat a healthy diet  Be sure to include plenty of vegetables, fruits, low-fat dairy products, and lean protein.  Do not eat a lot of foods high in solid fats, added sugars, or salt.  Get regular exercise. This is one of the most important things you can do for your health. ? Most adults should exercise for at least 150 minutes each week. The exercise should increase your heart rate and make you sweat (moderate-intensity exercise). ? Most adults should also do strengthening exercises at least twice a week. This is in addition to the moderate-intensity exercise. Maintain a healthy weight  Body mass index (BMI) is a measurement that can be used to identify possible weight problems. It estimates body fat based on height and weight. Your health care provider can help determine your BMI and help you achieve or maintain a healthy weight.  For females 40 years of age and older: ? A BMI below 18.5 is considered underweight. ? A BMI of 18.5 to 24.9 is normal. ? A BMI of 25 to 29.9 is  considered overweight. ? A BMI of 30 and above is considered obese. Watch levels of cholesterol and blood lipids  You should start having your blood tested for lipids and cholesterol at 54 years of age, then have this test every 5 years.  You may need to have your cholesterol levels checked more often if: ? Your lipid or cholesterol levels are high. ? You are older than 54 years of age. ? You are at high risk for heart disease. Cancer screening Lung Cancer  Lung cancer screening is recommended for adults 4-13 years old who are at high risk for lung cancer because of a history of smoking.  A yearly low-dose CT scan of the lungs is recommended for people who: ? Currently smoke. ? Have quit within the past 15 years. ? Have at least a 30-pack-year history of smoking. A pack year is smoking an average of one pack of cigarettes a day for 1 year.  Yearly screening should continue until it has been 15 years since you quit.  Yearly screening should stop if you develop a health problem that would prevent you from having lung cancer treatment. Breast Cancer  Practice breast self-awareness. This means understanding how your breasts normally appear and feel.  It also means doing regular breast self-exams. Let your health care provider know about any changes, no matter how small.  If you are in your 20s or 30s, you should have a clinical breast exam (CBE) by  a health care provider every 1-3 years as part of a regular health exam.  If you are 43 or older, have a CBE every year. Also consider having a breast X-ray (mammogram) every year.  If you have a family history of breast cancer, talk to your health care provider about genetic screening.  If you are at high risk for breast cancer, talk to your health care provider about having an MRI and a mammogram every year.  Breast cancer gene (BRCA) assessment is recommended for women who have family members with BRCA-related cancers. BRCA-related  cancers include: ? Breast. ? Ovarian. ? Tubal. ? Peritoneal cancers.  Results of the assessment will determine the need for genetic counseling and BRCA1 and BRCA2 testing. Cervical Cancer Your health care provider may recommend that you be screened regularly for cancer of the pelvic organs (ovaries, uterus, and vagina). This screening involves a pelvic examination, including checking for microscopic changes to the surface of your cervix (Pap test). You may be encouraged to have this screening done every 3 years, beginning at age 66.  For women ages 62-65, health care providers may recommend pelvic exams and Pap testing every 3 years, or they may recommend the Pap and pelvic exam, combined with testing for human papilloma virus (HPV), every 5 years. Some types of HPV increase your risk of cervical cancer. Testing for HPV may also be done on women of any age with unclear Pap test results.  Other health care providers may not recommend any screening for nonpregnant women who are considered low risk for pelvic cancer and who do not have symptoms. Ask your health care provider if a screening pelvic exam is right for you.  If you have had past treatment for cervical cancer or a condition that could lead to cancer, you need Pap tests and screening for cancer for at least 20 years after your treatment. If Pap tests have been discontinued, your risk factors (such as having a new sexual partner) need to be reassessed to determine if screening should resume. Some women have medical problems that increase the chance of getting cervical cancer. In these cases, your health care provider may recommend more frequent screening and Pap tests. Colorectal Cancer  This type of cancer can be detected and often prevented.  Routine colorectal cancer screening usually begins at 54 years of age and continues through 54 years of age.  Your health care provider may recommend screening at an earlier age if you have risk  factors for colon cancer.  Your health care provider may also recommend using home test kits to check for hidden blood in the stool.  A small camera at the end of a tube can be used to examine your colon directly (sigmoidoscopy or colonoscopy). This is done to check for the earliest forms of colorectal cancer.  Routine screening usually begins at age 87.  Direct examination of the colon should be repeated every 5-10 years through 54 years of age. However, you may need to be screened more often if early forms of precancerous polyps or small growths are found. Skin Cancer  Check your skin from head to toe regularly.  Tell your health care provider about any new moles or changes in moles, especially if there is a change in a mole's shape or color.  Also tell your health care provider if you have a mole that is larger than the size of a pencil eraser.  Always use sunscreen. Apply sunscreen liberally and repeatedly throughout the day.  Protect yourself by wearing long sleeves, pants, a wide-brimmed hat, and sunglasses whenever you are outside. Heart disease, diabetes, and high blood pressure  High blood pressure causes heart disease and increases the risk of stroke. High blood pressure is more likely to develop in: ? People who have blood pressure in the high end of the normal range (130-139/85-89 mm Hg). ? People who are overweight or obese. ? People who are African American.  If you are 51-73 years of age, have your blood pressure checked every 3-5 years. If you are 29 years of age or older, have your blood pressure checked every year. You should have your blood pressure measured twice-once when you are at a hospital or clinic, and once when you are not at a hospital or clinic. Record the average of the two measurements. To check your blood pressure when you are not at a hospital or clinic, you can use: ? An automated blood pressure machine at a pharmacy. ? A home blood pressure  monitor.  If you are between 54 years and 54 years old, ask your health care provider if you should take aspirin to prevent strokes.  Have regular diabetes screenings. This involves taking a blood sample to check your fasting blood sugar level. ? If you are at a normal weight and have a low risk for diabetes, have this test once every three years after 54 years of age. ? If you are overweight and have a high risk for diabetes, consider being tested at a younger age or more often. Preventing infection Hepatitis B  If you have a higher risk for hepatitis B, you should be screened for this virus. You are considered at high risk for hepatitis B if: ? You were born in a country where hepatitis B is common. Ask your health care provider which countries are considered high risk. ? Your parents were born in a high-risk country, and you have not been immunized against hepatitis B (hepatitis B vaccine). ? You have HIV or AIDS. ? You use needles to inject street drugs. ? You live with someone who has hepatitis B. ? You have had sex with someone who has hepatitis B. ? You get hemodialysis treatment. ? You take certain medicines for conditions, including cancer, organ transplantation, and autoimmune conditions. Hepatitis C  Blood testing is recommended for: ? Everyone born from [birth year] through 1966. ? Anyone with known risk factors for hepatitis C. Sexually transmitted infections (STIs)  You should be screened for sexually transmitted infections (STIs) including gonorrhea and chlamydia if: ? You are sexually active and are younger than 54 years of age. ? You are older than 54 years of age and your health care provider tells you that you are at risk for this type of infection. ? Your sexual activity has changed since you were last screened and you are at an increased risk for chlamydia or gonorrhea. Ask your health care provider if you are at risk.  If you do not have HIV, but are at risk, it may be  recommended that you take a prescription medicine daily to prevent HIV infection. This is called pre-exposure prophylaxis (PrEP). You are considered at risk if: ? You are sexually active and do not regularly use condoms or know the HIV status of your partner(s). ? You take drugs by injection. ? You are sexually active with a partner who has HIV. Talk with your health care provider about whether you are at high risk of being infected with HIV.  If you choose to begin PrEP, you should first be tested for HIV. You should then be tested every 3 months for as long as you are taking PrEP. Pregnancy  If you are premenopausal and you may become pregnant, ask your health care provider about preconception counseling.  If you may become pregnant, take 400 to 800 micrograms (mcg) of folic acid every day.  If you want to prevent pregnancy, talk to your health care provider about birth control (contraception). Osteoporosis and menopause  Osteoporosis is a disease in which the bones lose minerals and strength with aging. This can result in serious bone fractures. Your risk for osteoporosis can be identified using a bone density scan.  If you are 32 years of age or older, or if you are at risk for osteoporosis and fractures, ask your health care provider if you should be screened.  Ask your health care provider whether you should take a calcium or vitamin D supplement to lower your risk for osteoporosis.  Menopause may have certain physical symptoms and risks.  Hormone replacement therapy may reduce some of these symptoms and risks. Talk to your health care provider about whether hormone replacement therapy is right for you. Follow these instructions at home:  Schedule regular health, dental, and eye exams.  Stay current with your immunizations.  Do not use any tobacco products including cigarettes, chewing tobacco, or electronic cigarettes.  If you are pregnant, do not drink alcohol.  If you are  breastfeeding, limit how much and how often you drink alcohol.  Limit alcohol intake to no more than 1 drink per day for nonpregnant women. One drink equals 12 ounces of beer, 5 ounces of wine, or 1 ounces of hard liquor.  Do not use street drugs.  Do not share needles.  Ask your health care provider for help if you need support or information about quitting drugs.  Tell your health care provider if you often feel depressed.  Tell your health care provider if you have ever been abused or do not feel safe at home. This information is not intended to replace advice given to you by your health care provider. Make sure you discuss any questions you have with your health care provider. Document Released: 02/01/2011 Document Revised: 12/25/2015 Document Reviewed: 04/22/2015 Elsevier Interactive Patient Education  2019 [employer].

## 2019-02-21 ENCOUNTER — Other Ambulatory Visit (HOSPITAL_BASED_OUTPATIENT_CLINIC_OR_DEPARTMENT_OTHER): Payer: Self-pay | Admitting: Family Medicine

## 2019-02-21 DIAGNOSIS — Z1231 Encounter for screening mammogram for malignant neoplasm of breast: Secondary | ICD-10-CM

## 2019-02-22 LAB — TB SKIN TEST
Induration: 0 mm
TB Skin Test: NEGATIVE

## 2019-02-26 ENCOUNTER — Encounter: Payer: Self-pay | Admitting: Family Medicine

## 2019-03-01 ENCOUNTER — Other Ambulatory Visit: Payer: Self-pay

## 2019-03-01 ENCOUNTER — Ambulatory Visit (HOSPITAL_BASED_OUTPATIENT_CLINIC_OR_DEPARTMENT_OTHER)
Admission: RE | Admit: 2019-03-01 | Discharge: 2019-03-01 | Disposition: A | Discharge status: Home / Self Care | Payer: 59 | Source: Ambulatory Visit | Attending: Family Medicine | Admitting: Family Medicine

## 2019-03-01 DIAGNOSIS — Z1231 Encounter for screening mammogram for malignant neoplasm of breast: Secondary | ICD-10-CM | POA: Diagnosis present

## 2019-05-02 ENCOUNTER — Encounter: Payer: Self-pay | Admitting: Family Medicine

## 2019-05-02 DIAGNOSIS — M79671 Pain in right foot: Secondary | ICD-10-CM

## 2019-06-01 ENCOUNTER — Ambulatory Visit (INDEPENDENT_AMBULATORY_CARE_PROVIDER_SITE_OTHER): Payer: 59 | Admitting: Podiatry

## 2019-06-01 ENCOUNTER — Encounter: Payer: Self-pay | Admitting: Podiatry

## 2019-06-01 ENCOUNTER — Other Ambulatory Visit: Payer: Self-pay

## 2019-06-01 ENCOUNTER — Ambulatory Visit (INDEPENDENT_AMBULATORY_CARE_PROVIDER_SITE_OTHER): Payer: 59

## 2019-06-01 DIAGNOSIS — M779 Enthesopathy, unspecified: Secondary | ICD-10-CM

## 2019-06-01 DIAGNOSIS — M216X1 Other acquired deformities of right foot: Secondary | ICD-10-CM

## 2019-06-01 MED ORDER — MELOXICAM 15 MG PO TABS: 15.0000 mg | ORAL_TABLET | Freq: Every day | ORAL | 0 refills | Status: DC

## 2019-06-01 NOTE — Patient Instructions (Signed)
Meloxicam tablets What is this medicine? MELOXICAM (mel OX i cam) is a non-steroidal anti-inflammatory drug (NSAID). It is used to reduce swelling and to treat pain. It may be used for osteoarthritis, rheumatoid arthritis, or juvenile rheumatoid arthritis. This medicine may be used for other purposes; ask your health care provider or pharmacist if you have questions. COMMON BRAND NAME(S): Mobic What should I tell my health care provider before I take this medicine? They need to know if you have any of these conditions:  bleeding disorders  cigarette smoker  coronary artery bypass graft (CABG) surgery within the past 2 weeks  drink more than 3 alcohol-containing drinks per day  heart disease  high blood pressure  history of stomach bleeding  kidney disease  liver disease  lung or breathing disease, like asthma  stomach or intestine problems  an unusual or allergic reaction to meloxicam, aspirin, other NSAIDs, other medicines, foods, dyes, or preservatives  pregnant or trying to get pregnant  breast-feeding How should I use this medicine? Take this medicine by mouth with a full glass of water. Follow the directions on the prescription label. You can take it with or without food. If it upsets your stomach, take it with food. Take your medicine at regular intervals. Do not take it more often than directed. Do not stop taking except on your doctor's advice. A special MedGuide will be given to you by the pharmacist with each prescription and refill. Be sure to read this information carefully each time. Talk to your pediatrician regarding the use of this medicine in children. While this drug may be prescribed for selected conditions, precautions do apply. Patients over 16 years old may have a stronger reaction and need a smaller dose. Overdosage: If you think you have taken too much of this medicine contact a poison control center or emergency room at once. NOTE: This medicine is  only for you. Do not share this medicine with others. What if I miss a dose? If you miss a dose, take it as soon as you can. If it is almost time for your next dose, take only that dose. Do not take double or extra doses. What may interact with this medicine? Do not take this medicine with any of the following medications:  cidofovir  ketorolac This medicine may also interact with the following medications:  aspirin and aspirin-like medicines  certain medicines for blood pressure, heart disease, irregular heart beat  certain medicines for depression, anxiety, or psychotic disturbances  certain medicines that treat or prevent blood clots like warfarin, enoxaparin, dalteparin, apixaban, dabigatran, rivaroxaban  cyclosporine  diuretics  fluconazole  lithium  methotrexate  other NSAIDs, medicines for pain and inflammation, like ibuprofen and naproxen  pemetrexed This list may not describe all possible interactions. Give your health care provider a list of all the medicines, herbs, non-prescription drugs, or dietary supplements you use. Also tell them if you smoke, drink alcohol, or use illegal drugs. Some items may interact with your medicine. What should I watch for while using this medicine? Tell your doctor or healthcare provider if your symptoms do not start to get better or if they get worse. This medicine may cause serious skin reactions. They can happen weeks to months after starting the medicine. Contact your healthcare provider right away if you notice fevers or flu-like symptoms with a rash. The rash may be red or purple and then turn into blisters or peeling of the skin. Or, you might notice a red rash with  swelling of the face, lips or lymph nodes in your neck or under your arms. Do not take other medicines that contain aspirin, ibuprofen, or naproxen with this medicine. Side effects such as stomach upset, nausea, or ulcers may be more likely to occur. Many medicines  available without a prescription should not be taken with this medicine. This medicine can cause ulcers and bleeding in the stomach and intestines at any time during treatment. This can happen with no warning and may cause death. There is increased risk with taking this medicine for a long time. Smoking, drinking alcohol, older age, and poor health can also increase risks. Call your doctor right away if you have stomach pain or blood in your vomit or stool. This medicine does not prevent heart attack or stroke. In fact, this medicine may increase the chance of a heart attack or stroke. The chance may increase with longer use of this medicine and in people who have heart disease. If you take aspirin to prevent heart attack or stroke, talk with your doctor or healthcare provider. What side effects may I notice from receiving this medicine? Side effects that you should report to your doctor or health care professional as soon as possible:  allergic reactions like skin rash, itching or hives, swelling of the face, lips, or tongue  nausea, vomiting  redness, blistering, peeling, or loosening of the skin, including inside the mouth  signs and symptoms of a blood clot such as breathing problems; changes in vision; chest pain; severe, sudden headache; pain, swelling, warmth in the leg; trouble speaking; sudden numbness or weakness of the face, arm, or leg  signs and symptoms of bleeding such as bloody or black, tarry stools; red or dark-brown urine; spitting up blood or brown material that looks like coffee grounds; red spots on the skin; unusual bruising or bleeding from the eye, gums, or nose  signs and symptoms of liver injury like dark yellow or brown urine; general ill feeling or flu-like symptoms; light-colored stools; loss of appetite; nausea; right upper belly pain; unusually weak or tired; yellowing of the eyes or skin  signs and symptoms of stroke like changes in vision; confusion; trouble  speaking or understanding; severe headaches; sudden numbness or weakness of the face, arm, or leg; trouble walking; dizziness; loss of balance or coordination Side effects that usually do not require medical attention (report to your doctor or health care professional if they continue or are bothersome):  constipation  diarrhea  gas This list may not describe all possible side effects. Call your doctor for medical advice about side effects. You may report side effects to FDA at 1-800-FDA-1088. Where should I keep my medicine? Keep out of the reach of children. Store at room temperature between 15 and 30 degrees C (59 and 86 degrees F). Throw away any unused medicine after the expiration date. NOTE: This sheet is a summary. It may not cover all possible information. If you have questions about this medicine, talk to your doctor, pharmacist, or health care provider.  2020 Elsevier/Gold Standard (2018-10-18 11:21:28)  

## 2019-06-01 NOTE — Progress Notes (Signed)
Subjective:   Patient ID: Hailey Bryant, female   DOB: 53 y.o.   MRN: [id number]   HPI 53 year old female presents the office today for concerns of right foot pain which has been ongoing for around 6 months.  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  She does walk about 3 miles a day and since she has stopped her walking her pain is resolved.  She states that when she compresses her appointment she points to the medial to lateral compression of metatarsals this is where she gets discomfort.  However this has improved.  The recent injury to her foot no swelling.  She also states that she has a flatfoot.   Review of Systems  All other systems reviewed and are negative.  Past Medical History:  Diagnosis Date  . Colon polyps     Past Surgical History:  Procedure Laterality Date  . ABDOMINAL HYSTERECTOMY    . KNEE SURGERY       Current Outpatient Medications:  .  gabapentin (NEURONTIN) 300 MG capsule, TAKE 1 CAPSULE BY MOUTH EVERYDAY AT BEDTIME(CALL INSURANCE TO OPT OUT OF MAILORDER), Disp: 90 capsule, Rfl: 1 .  meloxicam (MOBIC) 15 MG tablet, Take 1 tablet (15 mg total) by mouth daily., Disp: 14 tablet, Rfl: 0  No Known Allergies        Objective:  Physical Exam  General: AAO x3, NAD  Dermatological: Skin is warm, dry and supple bilateral. Nails x 10 are well manicured; remaining integument appears unremarkable at this time. There are no open sores, no preulcerative lesions, no rash or signs of infection present.  Vascular: Dorsalis Pedis artery and Posterior Tibial artery pedal pulses are 2/4 bilateral with immedate capillary fill time. There is no pain with calf compression, swelling, warmth, erythema.   Neruologic: Grossly intact via light touch bilateral. Vibratory intact via tuning fork bilateral. Protective threshold with Semmes Wienstein monofilament intact to all pedal sites bilateral.  Negative Tinel sign.  Musculoskeletal: Mild  decrease in medial arch height upon weightbearing.  At this time there is no area pinpoint tenderness.  There is no pain with medial to lateral compression metatarsals.  No palpable neuroma.  She states that she was being handed to the top of her foot along the forefoot area but not able to elicit any pain today.  There is no edema, erythema.  Muscular strength 5/5 in all groups tested bilateral.  Toes appear to be rectus.  Gait: Unassisted, Nonantalgic.       Assessment:   Left foot capsulitis    Plan:  -Treatment options discussed including all alternatives, risks, and complications -Etiology of symptoms were discussed -X-rays were obtained and reviewed with the patient. No evidence of acute fracture or stress fracture.  Mild medial deviation of the second third MPJ.  Elongated second and third metatarsals. -I do with her pain is coming from biomechanical changes.  We discussed changing shoes as well as inserts for her shoes.  She is going to go to [employer] to look at shoes.  -Prescribed mobic. Discussed side effects of the medication and directed to stop if any are to occur and call the office.   Return in about 6 weeks (around 07/13/2019), or if symptoms worsen or fail to improve.  Trula Slade DPM

## 2020-01-14 NOTE — Progress Notes (Signed)
[place] [facility] [address] [place], [place] [address] [phone]  Date:  01/17/2020   Name:  Hailey Bryant   DOB:  1966/05/27   MRN:  [id number]  PCP:  Darreld Mclean, MD    Chief Complaint: Annual Exam   History of Present Illness:  Hailey Bryant is a 54 y.o. very pleasant female patient who presents with the following:  Pt with history of cervical cancer s/p hysterectomy here today for a CPE Last seen by myself about one year ago  Married with 3 adult children.  She does have 6 grandkids currently.    They are all school-age and doing well S/p hyst with one ovary remaining.  We do periodic vaginal cuff pap for her due to history of cervical dysplasia.  Reviewed this with her today and went over various guidelines.  The most conservative guidelines from ACOG states that screening should continue after hysterectomy for cervical cancer or abnormal Pap for 20 years.  The patient has not been on the 20-year cutoff, she would like to stop doing Pap screening at this time which is reasonable Pap 2019- normal  Labs one year ago  Colon- she will have done this year  covid series- done  shingirx  - she would like to start today  mammo due July- she will schedule this  Never a smoker, no alcohol She is sleeping really well  No issues with depression or falls   Has been taking gabapentin qhs for sciatica pain with good results - she is no longer taking this, does not need it any longer  Weight stable   She had a PPD placed on Tuesday- she has her PPD paper with her today and wonders if we can read the PPD for her  Placed on her right forearm.  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 Pre-diabetes 05/14/2015  . History of cervical cancer 02/26/2015  . Abnormal Pap smear of cervix 07/24/2014    Past Medical History:  Diagnosis Date  . Colon polyps     Past Surgical History:  Procedure Laterality Date  . ABDOMINAL HYSTERECTOMY    . KNEE SURGERY      Social History   Tobacco Use  . Smoking status: Never Smoker  . Smokeless tobacco: Never Used  Substance Use Topics  . Alcohol use: No    Alcohol/week: 0.0 standard drinks  . Drug use: No    Family History  Problem Relation Age of Onset  . Hypertension Mother   . Cancer Sister   . Breast cancer Sister     No Known Allergies  Medication list has been reviewed and updated.  No current outpatient medications on file prior to visit.   No current facility-administered medications on file prior to visit.    Review of Systems:  As per HPI- otherwise negative.   Physical Examination: Vitals:   01/17/20 0928  BP: 118/80  Pulse: 78  Resp: 17  Temp: (!) 97.5 F (36.4 C)  SpO2: 98%   Vitals:   01/17/20 0928  Weight: 215 lb (97.5 kg)  Height: 5\' 7"  (1.702 m)   Body mass index is 33.67 kg/m. Ideal Body Weight: Weight in (lb) to have BMI = 25: 159.3  GEN: no acute distress.  Obese, otherwise looks well HEENT: Atraumatic, Normocephalic.   Bilateral TM wnl, oropharynx normal.  PEERL,EOMI.   Ears and Nose: No external deformity. CV: RRR, No M/G/R. No JVD. No thrill. No extra heart sounds. PULM: CTA B, no wheezes, crackles, rhonchi. No retractions. No resp. distress. No accessory muscle use. ABD: S, NT, ND, +BS. No rebound. No HSM. EXTR: No c/c/e PSYCH: Normally interactive. Conversant.  PPD was placed on right forearm.  There is no induration or other reaction to TB skin testing apparent   Assessment and Plan: Physical exam  Pre-diabetes - Plan: Comprehensive metabolic panel, Hemoglobin A1c  Screening for deficiency anemia -  Plan: CBC  Screening for hyperlipidemia - Plan: Lipid panel  Screening for thyroid disorder - Plan: TSH  Encounter for hepatitis C screening test for low risk patient - Plan: Hepatitis C antibody  Immunization due - Plan: Varicella-zoster vaccine IM (Shingrix)  Here today for physical exam and health maintenance review Given first dose of Shingrix today, she will get her second dose in 2 to 6 months Labs pending as above She is due for a mammogram next month and colon cancer screening this year.  Patient reminded and is aware As above, we discussed post hysterectomy Pap smear.  The patient would like to discontinue at this time which is reasonable Encouraged healthy diet and exercise This visit occurred during the SARS-CoV-2 public health emergency.  Safety protocols were in place, including screening questions prior to the visit, additional usage of staff PPE, and extensive cleaning of exam room while observing appropriate contact time as indicated for disinfecting solutions.   Per our records, shingles vaccine was not actually administered today.  I am checking with patient to make sure this is accurate Signed Lamar Blinks, MD   Received her labs as below, message to patient  Results for orders placed or performed in visit on 01/17/20  CBC  Result Value Ref Range   WBC 4.5 4.0 - 10.5 K/uL   RBC 4.64 3.87 - 5.11 Mil/uL   Platelets 287.0 150 - 400 K/uL   Hemoglobin 13.3 12.0 - 15.0 g/dL   HCT 40.8 36 - 46 %   MCV 87.8 78.0 - 100.0 fl   MCHC 32.6 30.0 - 36.0 g/dL   RDW 14.2 11.5 - 15.5 %  Comprehensive metabolic panel  Result Value Ref Range   Sodium 136 135 - 145 mEq/L   Potassium 4.7 3.5 - 5.1 mEq/L   Chloride 100 96 - 112 mEq/L   CO2 31 19 - 32 mEq/L   Glucose, Bld 97 70 - 99 mg/dL   BUN 16 6 - 23 mg/dL   Creatinine, Ser 0.83 0.40 - 1.20 mg/dL   Total Bilirubin 0.6 0.2 - 1.2 mg/dL   Alkaline Phosphatase 70 39 - 117 U/L   AST 15 0 - 37 U/L   ALT 9 0 - 35 U/L    Total Protein 7.6 6.0 - 8.3 g/dL   Albumin 4.3 3.5 - 5.2 g/dL   GFR 86.34 >60.00 mL/min   Calcium 9.7 8.4 - 10.5 mg/dL  Hemoglobin A1c  Result Value Ref Range   Hgb A1c MFr Bld 5.7 4.6 - 6.5 %  Lipid panel  Result Value Ref Range   Cholesterol 253 (H) 0 - 200 mg/dL   Triglycerides 53.0 0 -  149 mg/dL   HDL 96.30 >39.00 mg/dL   VLDL 10.6 0.0 - 40.0 mg/dL   LDL Cholesterol 146 (H) 0 - 99 mg/dL   Total CHOL/HDL Ratio 3    NonHDL 156.79   TSH  Result Value Ref Range   TSH 1.36 0.35 - 4.50 uIU/mL   The 10-year ASCVD risk score Mikey Bussing DC Jr., et al., 2013) is: 1.8%   Values used to calculate the score:     Age: 63 years     Sex: Female     Is Non-Hispanic African American: Yes     Diabetic: No     Tobacco smoker: No     Systolic Blood Pressure: 599 mmHg     Is BP treated: No     HDL Cholesterol: 96.3 mg/dL     Total Cholesterol: 253 mg/dL

## 2020-01-14 NOTE — Patient Instructions (Addendum)
It was great to see you again today mammo due in July I will be in touch with your labs asap Continue to be physically active  Colon due this year- please be sure to contact your GI doctor to have this done     Health Maintenance, Female Adopting a healthy lifestyle and getting preventive care are important in promoting health and wellness. Ask your health care provider about:  The right schedule for you to have regular tests and exams.  Things you can do on your own to prevent diseases and keep yourself healthy. What should I know about diet, weight, and exercise? Eat a healthy diet   Eat a diet that includes plenty of vegetables, fruits, low-fat dairy products, and lean protein.  Do not eat a lot of foods that are high in solid fats, added sugars, or sodium. Maintain a healthy weight Body mass index (BMI) is used to identify weight problems. It estimates body fat based on height and weight. Your health care provider can help determine your BMI and help you achieve or maintain a healthy weight. Get regular exercise Get regular exercise. This is one of the most important things you can do for your health. Most adults should:  Exercise for at least 150 minutes each week. The exercise should increase your heart rate and make you sweat (moderate-intensity exercise).  Do strengthening exercises at least twice a week. This is in addition to the moderate-intensity exercise.  Spend less time sitting. Even light physical activity can be beneficial. Watch cholesterol and blood lipids Have your blood tested for lipids and cholesterol at 55 years of age, then have this test every 5 years. Have your cholesterol levels checked more often if:  Your lipid or cholesterol levels are high.  You are older than 55 years of age.  You are at high risk for heart disease. What should I know about cancer screening? Depending on your health history and family history, you may need to have cancer  screening at various ages. This may include screening for:  Breast cancer.  Cervical cancer.  Colorectal cancer.  Skin cancer.  Lung cancer. What should I know about heart disease, diabetes, and high blood pressure? Blood pressure and heart disease  High blood pressure causes heart disease and increases the risk of stroke. This is more likely to develop in people who have high blood pressure readings, are of African descent, or are overweight.  Have your blood pressure checked: ? Every 3-5 years if you are 48-3 years of age. ? Every year if you are 34 years old or older. Diabetes Have regular diabetes screenings. This checks your fasting blood sugar level. Have the screening done:  Once every three years after age 72 if you are at a normal weight and have a low risk for diabetes.  More often and at a younger age if you are overweight or have a high risk for diabetes. What should I know about preventing infection? Hepatitis B If you have a higher risk for hepatitis B, you should be screened for this virus. Talk with your health care provider to find out if you are at risk for hepatitis B infection. Hepatitis C Testing is recommended for:  Everyone born from [birth year] through 1966.  Anyone with known risk factors for hepatitis C. Sexually transmitted infections (STIs)  Get screened for STIs, including gonorrhea and chlamydia, if: ? You are sexually active and are younger than 55 years of age. ? You are older than 55  years of age and your health care provider tells you that you are at risk for this type of infection. ? Your sexual activity has changed since you were last screened, and you are at increased risk for chlamydia or gonorrhea. Ask your health care provider if you are at risk.  Ask your health care provider about whether you are at high risk for HIV. Your health care provider may recommend a prescription medicine to help prevent HIV infection. If you choose to take  medicine to prevent HIV, you should first get tested for HIV. You should then be tested every 3 months for as long as you are taking the medicine. Pregnancy  If you are about to stop having your period (premenopausal) and you may become pregnant, seek counseling before you get pregnant.  Take 400 to 800 micrograms (mcg) of folic acid every day if you become pregnant.  Ask for birth control (contraception) if you want to prevent pregnancy. Osteoporosis and menopause Osteoporosis is a disease in which the bones lose minerals and strength with aging. This can result in bone fractures. If you are 1 years old or older, or if you are at risk for osteoporosis and fractures, ask your health care provider if you should:  Be screened for bone loss.  Take a calcium or vitamin D supplement to lower your risk of fractures.  Be given hormone replacement therapy (HRT) to treat symptoms of menopause. Follow these instructions at home: Lifestyle  Do not use any products that contain nicotine or tobacco, such as cigarettes, e-cigarettes, and chewing tobacco. If you need help quitting, ask your health care provider.  Do not use street drugs.  Do not share needles.  Ask your health care provider for help if you need support or information about quitting drugs. Alcohol use  Do not drink alcohol if: ? Your health care provider tells you not to drink. ? You are pregnant, may be pregnant, or are planning to become pregnant.  If you drink alcohol: ? Limit how much you use to 0-1 drink a day. ? Limit intake if you are breastfeeding.  Be aware of how much alcohol is in your drink. In the [place] one drink equals one 12 oz bottle of beer (355 mL), one 5 oz glass of wine (148 mL), or one 1 oz glass of hard liquor (44 mL). General instructions  Schedule regular health, dental, and eye exams.  Stay current with your vaccines.  Tell your health care provider if: ? You often feel depressed. ? You have  ever been abused or do not feel safe at home. Summary  Adopting a healthy lifestyle and getting preventive care are important in promoting health and wellness.  Follow your health care provider's instructions about healthy diet, exercising, and getting tested or screened for diseases.  Follow your health care provider's instructions on monitoring your cholesterol and blood pressure. This information is not intended to replace advice given to you by your health care provider. Make sure you discuss any questions you have with your health care provider. Document Revised: 07/12/2018 Document Reviewed: 07/12/2018 Elsevier Patient Education  2020 [place] went over various guidelines

## 2020-01-16 ENCOUNTER — Other Ambulatory Visit (HOSPITAL_BASED_OUTPATIENT_CLINIC_OR_DEPARTMENT_OTHER): Payer: Self-pay | Admitting: Family Medicine

## 2020-01-16 DIAGNOSIS — Z1231 Encounter for screening mammogram for malignant neoplasm of breast: Secondary | ICD-10-CM

## 2020-01-17 ENCOUNTER — Encounter (HOSPITAL_BASED_OUTPATIENT_CLINIC_OR_DEPARTMENT_OTHER): Payer: 59

## 2020-01-17 ENCOUNTER — Encounter: Payer: Self-pay | Admitting: Family Medicine

## 2020-01-17 ENCOUNTER — Ambulatory Visit (INDEPENDENT_AMBULATORY_CARE_PROVIDER_SITE_OTHER): Payer: 59 | Admitting: Family Medicine

## 2020-01-17 ENCOUNTER — Other Ambulatory Visit: Payer: Self-pay

## 2020-01-17 VITALS — BP 118/80 | HR 78 | Temp 97.5°F | Resp 17 | Ht 67.0 in | Wt 215.0 lb

## 2020-01-17 DIAGNOSIS — Z23 Encounter for immunization: Secondary | ICD-10-CM

## 2020-01-17 DIAGNOSIS — R7303 Prediabetes: Secondary | ICD-10-CM

## 2020-01-17 DIAGNOSIS — Z1231 Encounter for screening mammogram for malignant neoplasm of breast: Secondary | ICD-10-CM

## 2020-01-17 DIAGNOSIS — Z13220 Encounter for screening for lipoid disorders: Secondary | ICD-10-CM | POA: Diagnosis not present

## 2020-01-17 DIAGNOSIS — Z130 Encounter for screening for diseases of the blood and blood-forming organs and certain disorders involving the immune mechanism: Secondary | ICD-10-CM | POA: Diagnosis not present

## 2020-01-17 DIAGNOSIS — Z1159 Encounter for screening for other viral diseases: Secondary | ICD-10-CM

## 2020-01-17 DIAGNOSIS — Z0000 Encounter for general adult medical examination without abnormal findings: Secondary | ICD-10-CM

## 2020-01-17 DIAGNOSIS — Z1329 Encounter for screening for other suspected endocrine disorder: Secondary | ICD-10-CM | POA: Diagnosis not present

## 2020-01-17 LAB — LIPID PANEL
Cholesterol: 253 mg/dL — ABNORMAL HIGH (ref 0–200)
HDL: 96.3 mg/dL (ref >39.00)
LDL Cholesterol: 146 mg/dL — ABNORMAL HIGH (ref 0–99)
NonHDL: 156.79
Total CHOL/HDL Ratio: 3
Triglycerides: 53 mg/dL (ref 0.0–149.0)
VLDL: 10.6 mg/dL (ref 0.0–40.0)

## 2020-01-17 LAB — CBC
HCT: 40.8 % (ref 36.0–46.0)
Hemoglobin: 13.3 g/dL (ref 12.0–15.0)
MCHC: 32.6 g/dL (ref 30.0–36.0)
MCV: 87.8 fl (ref 78.0–100.0)
Platelets: 287 10*3/uL (ref 150.0–400.0)
RBC: 4.64 Mil/uL (ref 3.87–5.11)
RDW: 14.2 % (ref 11.5–15.5)
WBC: 4.5 10*3/uL (ref 4.0–10.5)

## 2020-01-17 LAB — COMPREHENSIVE METABOLIC PANEL
ALT: 9 U/L (ref 0–35)
AST: 15 U/L (ref 0–37)
Albumin: 4.3 g/dL (ref 3.5–5.2)
Alkaline Phosphatase: 70 U/L (ref 39–117)
BUN: 16 mg/dL (ref 6–23)
CO2: 31 mEq/L (ref 19–32)
Calcium: 9.7 mg/dL (ref 8.4–10.5)
Chloride: 100 mEq/L (ref 96–112)
Creatinine, Ser: 0.83 mg/dL (ref 0.40–1.20)
GFR: 86.34 mL/min (ref >60.00)
Glucose, Bld: 97 mg/dL (ref 70–99)
Potassium: 4.7 mEq/L (ref 3.5–5.1)
Sodium: 136 mEq/L (ref 135–145)
Total Bilirubin: 0.6 mg/dL (ref 0.2–1.2)
Total Protein: 7.6 g/dL (ref 6.0–8.3)

## 2020-01-17 LAB — HEMOGLOBIN A1C: Hgb A1c MFr Bld: 5.7 % (ref 4.6–6.5)

## 2020-01-17 LAB — TSH: TSH: 1.36 u[IU]/mL (ref 0.35–4.50)

## 2020-01-18 LAB — HEPATITIS C ANTIBODY
Hepatitis C Ab: NONREACTIVE
SIGNAL TO CUT-OFF: 0.03 (ref <1.00)

## 2020-03-10 ENCOUNTER — Other Ambulatory Visit (HOSPITAL_BASED_OUTPATIENT_CLINIC_OR_DEPARTMENT_OTHER): Payer: Self-pay | Admitting: Family Medicine

## 2020-03-10 DIAGNOSIS — Z1231 Encounter for screening mammogram for malignant neoplasm of breast: Secondary | ICD-10-CM

## 2020-03-11 ENCOUNTER — Ambulatory Visit (HOSPITAL_BASED_OUTPATIENT_CLINIC_OR_DEPARTMENT_OTHER)
Admission: RE | Admit: 2020-03-11 | Discharge: 2020-03-11 | Disposition: A | Discharge status: Home / Self Care | Payer: 59 | Source: Ambulatory Visit | Attending: Family Medicine | Admitting: Family Medicine

## 2020-03-11 ENCOUNTER — Other Ambulatory Visit: Payer: Self-pay

## 2020-03-11 DIAGNOSIS — Z1231 Encounter for screening mammogram for malignant neoplasm of breast: Secondary | ICD-10-CM | POA: Insufficient documentation

## 2020-06-06 ENCOUNTER — Ambulatory Visit: Payer: 59 | Admitting: Family

## 2020-06-06 ENCOUNTER — Other Ambulatory Visit: Payer: Self-pay

## 2020-06-06 ENCOUNTER — Encounter: Payer: Self-pay | Admitting: Internal Medicine

## 2020-06-06 ENCOUNTER — Telehealth: Payer: Self-pay

## 2020-06-06 ENCOUNTER — Ambulatory Visit (INDEPENDENT_AMBULATORY_CARE_PROVIDER_SITE_OTHER): Payer: 59 | Admitting: Internal Medicine

## 2020-06-06 VITALS — BP 142/82 | HR 79 | Temp 97.8°F | Resp 18 | Ht 67.0 in | Wt 217.4 lb

## 2020-06-06 DIAGNOSIS — M79604 Pain in right leg: Secondary | ICD-10-CM | POA: Diagnosis not present

## 2020-06-06 DIAGNOSIS — G629 Polyneuropathy, unspecified: Secondary | ICD-10-CM

## 2020-06-06 NOTE — Patient Instructions (Signed)
We are referring you to neurology.  If they do not contact you within few days let me know.  If your pain gets much worse let us know as well.

## 2020-06-06 NOTE — Telephone Encounter (Signed)
Anderson Malta spoke with patient this morning regarding arm numbness, extremity swelling This has been going on for a while according to the patient. She missed her appointment with Melissa this morning b accident-She was sent to triage to be evaluated and determine on whether she is to come in to our office for another appointment or go to the ER. It was determined for her to come in to our office. She was told to come in today at 1:40-however the schedule jumped when the appointment was scheduled in error on epic. I spoke with Dr. Ethel Rana assistant and was advised it was ok to put her on his schedule at 2:40 today.   Patient notified and verbalized understanding to come in at 2:20 for check in.

## 2020-06-06 NOTE — Progress Notes (Signed)
° °  Subjective:    Patient ID: Hailey Bryant, female    DOB: [birth year], 54 y.o.   MRN: [id number]  DOS:  06/06/2020 Type of visit - description: Acute Having pain at the right leg for a year, initially at the dorsum of the right foot, subsequently at the posterior calf. 10 days ago, the pain increase and is described as "tingling, needles, walking on glass".  She denies any back pain. No rash No edema No injury.  Today she also developed left hand pain at the middle knuckle.   Review of Systems See above   Past Medical History:  Diagnosis Date   Colon polyps     Past Surgical History:  Procedure Laterality Date   ABDOMINAL HYSTERECTOMY     KNEE SURGERY      Allergies as of 06/06/2020   No Known Allergies     Medication List    as of June 06, 2020  3:07 PM   You have not been prescribed any medications.        Objective:   Physical Exam Skin:        BP (!) 142/82 (BP Location: Left Arm, Patient Position: Sitting, Cuff Size: Normal)    Pulse 79    Temp 97.8 F (36.6 C) (Oral)    Resp 18    Ht 5\' 7"  (1.702 m)    Wt 217 lb 6 oz (98.6 kg)    SpO2 99%    BMI 34.05 kg/m  General:   Well developed, NAD, BMI noted. HEENT:  Normocephalic . Face symmetric, atraumatic MSK: Left hand: Slightly TTP at the middle knuckle Lower extremities: No edema, calf symmetric Pedal pulses present, extremities warm. Skin: Not pale. Not jaundice Neurologic:  alert & oriented X3.  Speech normal, gait appropriate for age and unassisted DTRs and motor symmetric.  Sensitivity sense intact Psych--  Cognition and judgment appear intact.  Cooperative with normal attention span and concentration.  Behavior appropriate. No anxious or depressed appearing.      Assessment    54 year old female,PMH includes an abnormal Pap smear, history of cervical cancer, s/p hysterectomy, presents with  Right leg pain: As described above, the pain seems to be nerve related.  S2  radiculopathy?  Sural cutaneous  nerve neuropathy? Does not seem to be vascular or MSK. Plan: Refer to neurology. Trial with gabapentin: Declined Left middle knuckle pain: Pain is started yesterday, area is slightly tender, recommend to see PCP if problem persist or increase.   This visit occurred during the SARS-CoV-2 public health emergency.  Safety protocols were in place, including screening questions prior to the visit, additional usage of staff PPE, and extensive cleaning of exam room while observing appropriate contact time as indicated for disinfecting solutions.

## 2020-06-06 NOTE — Progress Notes (Signed)
Pre visit review using our clinic review tool, if applicable. No additional management support is needed unless otherwise documented below in the visit note. 

## 2020-06-09 ENCOUNTER — Ambulatory Visit: Payer: 59 | Admitting: Family

## 2020-06-09 ENCOUNTER — Ambulatory Visit: Payer: 59 | Admitting: Medical

## 2020-09-08 NOTE — Progress Notes (Signed)
[facility]    Provider:  Dr Jaynee Eagles Requesting Provider: Colon Branch, MD Primary Care Provider:  Darreld Mclean, MD  CC:  Numbness and tingling  HPI:  Hailey Bryant is a 55 y.o. female here as requested by Colon Branch, MD for neuropathy. PMHx pre-diabetes.  I reviewed Dr. Gilberto Better notes: Patient having pain at the right leg for a year, initially at the dorsum of the right foot, subsequently at the posterior calf, 10 days prior to appointment (June 06, 2020), the pain increased and she described as tingling needles and walking on glass.  She denied any back pain, no rash, no edema, no injury.  Examination showed normal general exam, normal head, eyes, ears, nose, throat, no edema, calf symmetric, pedal pulses present, normal neurologic exam including speech, gait, DTRs and motor, sensitivity intact, behavior appropriate.  Unclear etiology, pain seems to be nerve related, did not seem to be vascular or musculoskeletal, she was referred to neurology.  Pain started in 2019 in the right foot, XR of the foot was ok, hurt on the top of the foot, no tingling/numbness just pain. The foot doctor prescribed new shoes and arch support. Turned into numbness, tingling, slowly progressed, now radiates the back of the leg to the heel, not on the top of the foot, Like her leg is asleep. All the way up to the buttocks but mostly concentrated in the back of the calf. Better laying down, the minute she wake and take first steps she can feel it, no weakness, waxes and wanes throughout the day. She denies any back pain, not as strong from her thigh to the buttocks. At the time she as doing a lot of gardening when it started, she was bending down and crouching and gardening. Burns when she walks. Not significantly painful. No other focal neurologic deficits, associated symptoms, inciting events or modifiable factors.  Reviewed notes, labs and imaging from outside physicians, which showed:   DG  lumbar spine 11/23/2017: disc disease is noted at T12-L1. Remaining disc spaces are intact. Posterior facet joints are unremarkable.  IMPRESSION: Moderate degenerative disc disease is noted at T12-L1. No acute abnormality seen in the lumbar spine.   Review of Systems: Patient complains of symptoms per HPI as well as the following symptoms Numbness and tingling. Pertinent negatives and positives per HPI. All others negative.   Social History   Socioeconomic History  . Marital status: Married    Spouse name: Not on file  . Number of children: 3  . Years of education: Not on file  . Highest education level: Some college, no degree  Occupational History  . Occupation: Therapist, art  Tobacco Use  . Smoking status: Never Smoker  . Smokeless tobacco: Never Used  Vaping Use  . Vaping Use: Never used  Substance and Sexual Activity  . Alcohol use: Never    Alcohol/week: 0.0 standard drinks  . Drug use: Never  . Sexual activity: Not on file  Other Topics Concern  . Not on file  Social History Narrative   Lives at home with husband   Right handed   Caffeine: 8 oz three times a week at the most    Social Determinants of Health   Financial Resource Strain: Not on file  Food Insecurity: Not on file  Transportation Needs: Not on file  Physical Activity: Not on file  Stress: Not on file  Social Connections: Not on file  Intimate Partner Violence: Not on file  Family History  Problem Relation Age of Onset  . Hypertension Mother   . Cancer Sister   . Breast cancer Sister   . Neuropathy Sister        "that's because she drinks"    Past Medical History:  Diagnosis Date  . Colon polyps     Patient Active Problem List   Diagnosis Date Noted  . Numbness and tingling of right leg 09/09/2020  . Pre-diabetes 05/14/2015  . History of cervical cancer 02/26/2015  . Abnormal Pap smear of cervix 07/24/2014    Past Surgical History:  Procedure Laterality Date  . ABDOMINAL  HYSTERECTOMY    . KNEE SURGERY      Current Outpatient Medications  Medication Sig Dispense Refill  . Calcium Carb-Cholecalciferol (CALCIUM 600+D3 PO) Take 1 tablet by mouth daily.    Marland Kitchen GLUCOSAMINE-CHONDROITIN PO Take 2 tablets by mouth daily. Glucosamine 1500 mg and Chrondroitin 1200 mg.     No current facility-administered medications for this visit.    Allergies as of 09/09/2020  . (No Known Allergies)    Vitals: BP 122/80 (BP Location: Right Arm, Patient Position: Sitting)   Pulse 68   Ht 5\' 7"  (1.702 m)   Wt 215 lb (97.5 kg)   BMI 33.67 kg/m  Last Weight:  Wt Readings from Last 1 Encounters:  09/09/20 215 lb (97.5 kg)   Last Height:   Ht Readings from Last 1 Encounters:  09/09/20 5\' 7"  (1.702 m)     Physical exam: Exam: Gen: NAD, conversant, well nourised, obese, well groomed                     CV: RRR, no MRG. No Carotid Bruits. No peripheral edema, warm, nontender Eyes: Conjunctivae clear without exudates or hemorrhage  Neuro: Detailed Neurologic Exam  Speech:    Speech is normal; fluent and spontaneous with normal comprehension.  Cognition:    The patient is oriented to person, place, and time;     recent and remote memory intact;     language fluent;     normal attention, concentration,     fund of knowledge Cranial Nerves:    The pupils are equal, round, and reactive to light. The fundi are normal and spontaneous venous pulsations are present. Visual fields are full to finger confrontation. Extraocular movements are intact. Trigeminal sensation is intact and the muscles of mastication are normal. The face is symmetric. The palate elevates in the midline. Hearing intact. Voice is normal. Shoulder shrug is normal. The tongue has normal motion without fasciculations.   Coordination:    Normal finger to nose and heel to shin. Normal rapid alternating movements.   Gait:    Normal native gait  Motor Observation:    No asymmetry, no atrophy, and no  involuntary movements noted. Tone:    Normal muscle tone.    Posture:    Posture is normal. normal erect    Strength: right leg flexion weakness 5-/5, otherwise strength is V/V in the upper and lower limbs.      Sensation: intact to LT     Reflex Exam:  DTR's:    Deep tendon reflexes in the upper and lower extremities are normal bilaterally.   Toes:    The toes are downgoing bilaterally.   Clonus:    Clonus is absent.    Assessment/Plan:    Orders Placed This Encounter  Procedures  . Ambulatory referral to Physical Therapy   No orders of the defined  types were placed in this encounter.   Cc: Colon Branch, MD,  Cop[place], Gay Filler, MD  Sarina Ill, MD  [facility] [address] [place], [place] [address]  Phone [phone] Fax [phone]

## 2020-09-09 ENCOUNTER — Telehealth: Payer: Self-pay | Admitting: Neurology

## 2020-09-09 ENCOUNTER — Ambulatory Visit (INDEPENDENT_AMBULATORY_CARE_PROVIDER_SITE_OTHER): Payer: 59 | Admitting: Neurology

## 2020-09-09 ENCOUNTER — Other Ambulatory Visit: Payer: Self-pay

## 2020-09-09 ENCOUNTER — Encounter: Payer: Self-pay | Admitting: Neurology

## 2020-09-09 VITALS — BP 122/80 | HR 68 | Ht 67.0 in | Wt 215.0 lb

## 2020-09-09 DIAGNOSIS — R202 Paresthesia of skin: Secondary | ICD-10-CM

## 2020-09-09 DIAGNOSIS — R200 Anesthesia of skin: Secondary | ICD-10-CM | POA: Diagnosis not present

## 2020-09-09 NOTE — Telephone Encounter (Signed)
Pt. wants to let Dr. Jaynee Eagles know thanks for listening to her & doing what was best for her. She states doctor actually listened because you don't get that at most places. She states she is in tears that Dr. Jaynee Eagles was so amazing. Pt. states she wants to do a survey to tell of her great experience. She states thank you & her appt. meant the world to her.

## 2020-09-09 NOTE — Patient Instructions (Signed)
Pinched nerve in the back (Sciatica) or Peroneal neuropathy Conservative measures: Do not cross legs or crouch.  Physical therapy  If worsens: options include  MRI of the lumbar spine and/or EMG/NCS   Common Peroneal Nerve Entrapment  Common peroneal nerve entrapment is a condition that can make it hard to lift a foot. The condition results from pressure on a nerve in the lower leg called the common peroneal nerve. Your common peroneal nerve provides feeling to your outer lower leg and foot. It also supplies the muscles that move your foot and toes upward and outward. What are the causes? This condition may be caused by:  Sitting cross-legged, squatting, or kneeling for long periods of time.  A hard, direct hit to the side of the lower leg.  Swelling from a knee injury.  A break (fracture) in one of the lower leg bones.  Wearing a boot or cast that ends just below the knee.  A growth or cyst near the nerve. What increases the risk? This condition is more likely to develop in people who play:  Contact sports, such as football or hockey.  Sports where you wear high and stiff boots, such as skiing. What are the signs or symptoms? Symptoms of this condition include:  Trouble lifting your foot up (foot drop).  Tripping often.  Your foot hitting the ground harder than normal as you walk.  Numbness, tingling, or pain in the outside of the knee, outside of the lower leg, and top of the foot.  Sensitivity to pressure on the front or side of the leg. How is this diagnosed? This condition may be diagnosed based on:  Your symptoms.  Your medical history.  A physical exam.  Tests, such as: ? An X-ray to check the bones of your knee and leg. ? MRI to check tendons that attach to the side of your knee. ? An ultrasound to check for a growth or cyst. ? An electromyogram (EMG) to check your nerves. During your physical exam, your health care provider will check for numbness in  your leg and test the strength of your lower leg muscles. He or she may tap the side of your lower leg to see if that causes tingling. How is this treated? Treatment for this condition may include:  Avoiding activities that make symptoms worse.  Using a brace to hold up your foot and toes.  Taking anti-inflammatory pain medicines to relieve swelling and lessen pain.  Having medicines injected into your ankle joint to lessen pain and swelling.  Doing exercises to help you regain or maintain movement (physical therapy).  Surgery to take pressure off the nerve. This may be needed if there is no improvement after 2-3 months or if there is a growth pushing on the nerve.  Returning gradually to full activity. Follow these instructions at home: If you have a brace:  Wear it as told by your health care provider. Remove it only as told by your health care provider.  Loosen the brace if your toes tingle, become numb, or turn cold and blue.  Keep the brace clean.  If the brace is not waterproof: ? Do not let it get wet. ? Cover it with a watertight covering when you take a bath or a shower.  Ask your health care provider when it is safe to drive with a brace on your foot. Activity  Return to your normal activities as told by your health care provider. Ask your health care provider what  activities are safe for you.  Do not do any activities that make pain or swelling worse.  Do exercises as told by your health care provider. General instructions  Take over-the-counter and prescription medicines only as told by your health care provider.  Do not put your full weight on your knee until your health care provider says you can. Use crutches as directed by your health care provider.  Keep all follow-up visits as told by your health care provider. This is important. How is this prevented?  Wear supportive footwear that is appropriate for your athletic activity.  Avoid athletic  activities that cause ankle pain or swelling.  Wear protective padding over your lower legs when playing contact sports.  Make sure your boots do not put extra pressure on the area just below your knees.  Do not sit cross-legged for long periods of time. Contact a health care provider if:  Your symptoms do not get better in 2-3 months.  The weakness or numbness in your leg or foot gets worse. Summary  Common peroneal nerve entrapment is a condition that results from pressure on a nerve in the lower leg called the common peroneal nerve.  This condition may be caused by a hard hit, swelling, a fracture, or a cyst in the lower leg.  Treatment may include rest, a brace, medicines, and physical therapy. Sometimes surgery is needed.  Do not do any activities that make pain or swelling worse. This information is not intended to replace advice given to you by your health care provider. Make sure you discuss any questions you have with your health care provider. Document Revised: 05/29/2018 Document Reviewed: 05/29/2018 Elsevier Patient Education  2021 [employer].  Sciatica  Sciatica is pain, numbness, weakness, or tingling along the path of the sciatic nerve. The sciatic nerve starts in the lower back and runs down the back of each leg. The nerve controls the muscles in the lower leg and in the back of the knee. It also provides feeling (sensation) to the back of the thigh, the lower leg, and the sole of the foot. Sciatica is a symptom of another medical condition that pinches or puts pressure on the sciatic nerve. Sciatica most often only affects one side of the body. Sciatica usually goes away on its own or with treatment. In some cases, sciatica may come back (recur). What are the causes? This condition is caused by pressure on the sciatic nerve or pinching of the nerve. This may be the result of:  A disk in between the bones of the spine bulging out too far (herniated  disk).  Age-related changes in the spinal disks.  A pain disorder that affects a muscle in the buttock.  Extra bone growth near the sciatic nerve.  A break (fracture) of the pelvis.  Pregnancy.  Tumor. This is rare. What increases the risk? The following factors may make you more likely to develop this condition:  Playing sports that place pressure or stress on the spine.  Having poor strength and flexibility.  A history of back injury or surgery.  Sitting for long periods of time.  Doing activities that involve repetitive bending or lifting.  Obesity. What are the signs or symptoms? Symptoms can vary from mild to very severe, and they may include:  Any of these problems in the lower back, leg, hip, or buttock: ? Mild tingling, numbness, or dull aches. ? Burning sensations. ? Sharp pains.  Numbness in the back of the calf or  the sole of the foot.  Leg weakness.  Severe back pain that makes movement difficult. Symptoms may get worse when you cough, sneeze, or laugh, or when you sit or stand for long periods of time. How is this diagnosed? This condition may be diagnosed based on:  Your symptoms and medical history.  A physical exam.  Blood tests.  Imaging tests, such as: ? X-rays. ? MRI. ? CT scan. How is this treated? In many cases, this condition improves on its own without treatment. However, treatment may include:  Reducing or modifying physical activity.  Exercising and stretching.  Icing and applying heat to the affected area.  Medicines that help to: ? Relieve pain and swelling. ? Relax your muscles.  Injections of medicines that help to relieve pain, irritation, and inflammation around the sciatic nerve (steroids).  Surgery. Follow these instructions at home: Medicines  Take over-the-counter and prescription medicines only as told by your health care provider.  Ask your health care provider if the medicine prescribed to  you: ? Requires you to avoid driving or using heavy machinery. ? Can cause constipation. You may need to take these actions to prevent or treat constipation:  Drink enough fluid to keep your urine pale yellow.  Take over-the-counter or prescription medicines.  Eat foods that are high in fiber, such as beans, whole grains, and fresh fruits and vegetables.  Limit foods that are high in fat and processed sugars, such as fried or sweet foods. Managing pain  If directed, put ice on the affected area. ? Put ice in a plastic bag. ? Place a towel between your skin and the bag. ? Leave the ice on for 20 minutes, 2-3 times a day.  If directed, apply heat to the affected area. Use the heat source that your health care provider recommends, such as a moist heat pack or a heating pad. ? Place a towel between your skin and the heat source. ? Leave the heat on for 20-30 minutes. ? Remove the heat if your skin turns bright red. This is especially important if you are unable to feel pain, heat, or cold. You may have a greater risk of getting burned.      Activity  Return to your normal activities as told by your health care provider. Ask your health care provider what activities are safe for you.  Avoid activities that make your symptoms worse.  Take brief periods of rest throughout the day. ? When you rest for longer periods, mix in some mild activity or stretching between periods of rest. This will help to prevent stiffness and pain. ? Avoid sitting for long periods of time without moving. Get up and move around at least one time each hour.  Exercise and stretch regularly, as told by your health care provider.  Do not lift anything that is heavier than 10 lb (4.5 kg) while you have symptoms of sciatica. When you do not have symptoms, you should still avoid heavy lifting, especially repetitive heavy lifting.  When you lift objects, always use proper lifting technique, which includes: ? Bending  your knees. ? Keeping the load close to your body. ? Avoiding twisting.   General instructions  Maintain a healthy weight. Excess weight puts extra stress on your back.  Wear supportive, comfortable shoes. Avoid wearing high heels.  Avoid sleeping on a mattress that is too soft or too hard. A mattress that is firm enough to support your back when you sleep may help to reduce  your pain.  Keep all follow-up visits as told by your health care provider. This is important. Contact a health care provider if:  You have pain that: ? Wakes you up when you are sleeping. ? Gets worse when you lie down. ? Is worse than you have experienced in the past. ? Lasts longer than 4 weeks.  You have an unexplained weight loss. Get help right away if:  You are not able to control when you urinate or have bowel movements (incontinence).  You have: ? Weakness in your lower back, pelvis, buttocks, or legs that gets worse. ? Redness or swelling of your back. ? A burning sensation when you urinate. Summary  Sciatica is pain, numbness, weakness, or tingling along the path of the sciatic nerve.  This condition is caused by pressure on the sciatic nerve or pinching of the nerve.  Sciatica can cause pain, numbness, or tingling in the lower back, legs, hips, and buttocks.  Treatment often includes rest, exercise, medicines, and applying ice or heat. This information is not intended to replace advice given to you by your health care provider. Make sure you discuss any questions you have with your health care provider. Document Revised: 08/07/2018 Document Reviewed: 08/07/2018 Elsevier Patient Education  2021 [place] is a test to check how well your muscles and nerves are working. This procedure includes the combined use of electromyogram (EMG) and nerve conduction study (NCS). EMG is used to look for muscular disorders. NCS, which is also called  electroneurogram, measures how well your nerves are controlling your muscles. The procedures are usually done together to check if your muscles and nerves are healthy. If the results of the tests are abnormal, this may indicate disease or injury, such as a neuromuscular disease or peripheral nerve damage. Tell a health care provider about:  Any allergies you have.  All medicines you are taking, including vitamins, herbs, eye drops, creams, and over-the-counter medicines.  Any problems you or family members have had with anesthetic medicines.  Any blood disorders you have.  Any surgeries you have had.  Any medical conditions you have.  If you have a pacemaker.  Whether you are pregnant or may be pregnant. What are the risks? Generally, this is a safe procedure. However, problems may occur, including:  Infection where the electrodes were inserted.  Bleeding. What happens before the procedure? Medicines Ask your health care provider about:  Changing or stopping your regular medicines. This is especially important if you are taking diabetes medicines or blood thinners.  Taking medicines such as aspirin and ibuprofen. These medicines can thin your blood. Do not take these medicines unless your health care provider tells you to take them.  Taking over-the-counter medicines, vitamins, herbs, and supplements. General instructions  Your health care provider may ask you to avoid: ? Beverages that have caffeine, such as coffee and tea. ? Any products that contain nicotine or tobacco. These products include cigarettes, e-cigarettes, and chewing tobacco. If you need help quitting, ask your health care provider.  Do not use lotions or creams on the same day that you will be having the procedure. What happens during the procedure? For EMG  Your health care provider will ask you to stay in a position so that he or she can access the muscle that will be studied. You may be standing,  sitting, or lying down.  You may be given a medicine that numbs the area (local anesthetic).  A very thin  needle that has an electrode will be inserted into your muscle.  Another small electrode will be placed on your skin near the muscle.  Your health care provider will ask you to continue to remain still.  The electrodes will send a signal that tells about the electrical activity of your muscles. You may see this on a monitor or hear it in the room.  After your muscles have been studied at rest, your health care provider will ask you to contract or flex your muscles. The electrodes will send a signal that tells about the electrical activity of your muscles.  Your health care provider will remove the electrodes and the electrode needles when the procedure is finished. The procedure may vary among health care providers and hospitals.   For NCS  An electrode that records your nerve activity (recording electrode) will be placed on your skin by the muscle that is being studied.  An electrode that is used as a reference (reference electrode) will be placed near the recording electrode.  A paste or gel will be applied to your skin between the recording electrode and the reference electrode.  Your nerve will be stimulated with a mild shock. Your health care provider will measure how much time it takes for your muscle to react.  Your health care provider will remove the electrodes and the gel when the procedure is finished. The procedure may vary among health care providers and hospitals.   What happens after the procedure?  It is up to you to get the results of your procedure. Ask your health care provider, or the department that is doing the procedure, when your results will be ready.  Your health care provider may: ? Give you medicines for any pain. ? Monitor the insertion sites to make sure that bleeding stops. Summary  Electromyoneurogram is a test to check how well your muscles  and nerves are working.  If the results of the tests are abnormal, this may indicate disease or injury.  This is a safe procedure. However, problems may occur, such as bleeding and infection.  Your health care provider will do two tests to complete this procedure. One checks your muscles (EMG) and another checks your nerves (NCS).  It is up to you to get the results of your procedure. Ask your health care provider, or the department that is doing the procedure, when your results will be ready. This information is not intended to replace advice given to you by your health care provider. Make sure you discuss any questions you have with your health care provider. Document Revised: 04/04/2018 Document Reviewed: 03/17/2018 Elsevier Patient Education  [employer].

## 2020-11-04 ENCOUNTER — Emergency Department (HOSPITAL_COMMUNITY)
Admission: EM | Admit: 2020-11-04 | Discharge: 2020-11-04 | Disposition: A | Discharge status: Home / Self Care | Payer: 59 | Attending: Emergency Medicine | Admitting: Emergency Medicine

## 2020-11-04 ENCOUNTER — Encounter (HOSPITAL_COMMUNITY): Payer: Self-pay

## 2020-11-04 ENCOUNTER — Other Ambulatory Visit: Payer: Self-pay

## 2020-11-04 ENCOUNTER — Emergency Department (HOSPITAL_COMMUNITY): Payer: 59

## 2020-11-04 DIAGNOSIS — Z8541 Personal history of malignant neoplasm of cervix uteri: Secondary | ICD-10-CM | POA: Insufficient documentation

## 2020-11-04 DIAGNOSIS — R42 Dizziness and giddiness: Secondary | ICD-10-CM | POA: Insufficient documentation

## 2020-11-04 LAB — COMPREHENSIVE METABOLIC PANEL
ALT: 17 U/L (ref 0–44)
AST: 22 U/L (ref 15–41)
Albumin: 4.1 g/dL (ref 3.5–5.0)
Alkaline Phosphatase: 69 U/L (ref 38–126)
Anion gap: 7 (ref 5–15)
BUN: 12 mg/dL (ref 6–20)
CO2: 29 mmol/L (ref 22–32)
Calcium: 9.5 mg/dL (ref 8.9–10.3)
Chloride: 105 mmol/L (ref 98–111)
Creatinine, Ser: 0.77 mg/dL (ref 0.44–1.00)
GFR, Estimated: >60 mL/min (ref >60)
Glucose, Bld: 95 mg/dL (ref 70–99)
Potassium: 4.5 mmol/L (ref 3.5–5.1)
Sodium: 141 mmol/L (ref 135–145)
Total Bilirubin: 0.6 mg/dL (ref 0.3–1.2)
Total Protein: 8.1 g/dL (ref 6.5–8.1)

## 2020-11-04 LAB — CBC WITH DIFFERENTIAL/PLATELET
Abs Immature Granulocytes: 0.01 10*3/uL (ref 0.00–0.07)
Basophils Absolute: 0 10*3/uL (ref 0.0–0.1)
Basophils Relative: 1 %
Eosinophils Absolute: 0.1 10*3/uL (ref 0.0–0.5)
Eosinophils Relative: 3 %
HCT: 41.8 % (ref 36.0–46.0)
Hemoglobin: 13.4 g/dL (ref 12.0–15.0)
Immature Granulocytes: 0 %
Lymphocytes Relative: 42 %
Lymphs Abs: 2.2 10*3/uL (ref 0.7–4.0)
MCH: 28.2 pg (ref 26.0–34.0)
MCHC: 32.1 g/dL (ref 30.0–36.0)
MCV: 87.8 fL (ref 80.0–100.0)
Monocytes Absolute: 0.4 10*3/uL (ref 0.1–1.0)
Monocytes Relative: 9 %
Neutro Abs: 2.4 10*3/uL (ref 1.7–7.7)
Neutrophils Relative %: 45 %
Platelets: 332 10*3/uL (ref 150–400)
RBC: 4.76 MIL/uL (ref 3.87–5.11)
RDW: 13.1 % (ref 11.5–15.5)
WBC: 5.2 10*3/uL (ref 4.0–10.5)
nRBC: 0 % (ref 0.0–0.2)

## 2020-11-04 LAB — TROPONIN I (HIGH SENSITIVITY)
Troponin I (High Sensitivity): 3 ng/L (ref <18)
Troponin I (High Sensitivity): 3 ng/L (ref <18)

## 2020-11-04 MED ORDER — SODIUM CHLORIDE 0.9 % IV BOLUS
1000.0000 mL | Freq: Once | INTRAVENOUS | Status: AC
  Administered 2020-11-04: 1000 mL via INTRAVENOUS

## 2020-11-04 NOTE — ED Triage Notes (Signed)
Emergency Medicine Provider Triage Evaluation Note  Hailey Bryant , a 55 y.o. female  was evaluated in triage.  Pt complains of light headed and near syncope. Only has near syncope with position changes. No changes in meds.  Hypertensive. Mild headache.   Physical Exam  BP (!) 155/90 (BP Location: Right Arm)   Pulse 68   Temp 98 F (36.7 C) (Oral)   Resp 16   SpO2 100%  Patient is awake and alert, no distress.  Respirations are even and unlabored.    Medical Decision Making  Medically screening exam initiated at 12:50 PM.  Appropriate orders placed.  Hailey Bryant was informed that the remainder of the evaluation will be completed by another provider, this initial triage assessment does not replace that evaluation, and the importance of remaining in the ED until their evaluation is complete.     Lorin Glass, [place] 11/04/20 1252

## 2020-11-04 NOTE — ED Provider Notes (Signed)
[facility] DEPT Provider Note   CSN: [id number] Arrival date & time: 11/04/20  1206     History Chief Complaint  Patient presents with  . Dizziness    Hailey Bryant is a 55 y.o. female.  Pt presents to the ED today with dizziness.  Pt said she woke up this morning feeling off balance.  She checked her bp and it was elevated.  She denies h/a.  She said she is feeling a little better now.  She said the dizziness was not when she turned her head.  It was when she stood up quickly.        Past Medical History:  Diagnosis Date  . Colon polyps     Patient Active Problem List   Diagnosis Date Noted  . Numbness and tingling of right leg 09/09/2020  . Pre-diabetes 05/14/2015  . History of cervical cancer 02/26/2015  . Abnormal Pap smear of cervix 07/24/2014    Past Surgical History:  Procedure Laterality Date  . ABDOMINAL HYSTERECTOMY    . KNEE SURGERY       OB History   No obstetric history on file.     Family History  Problem Relation Age of Onset  . Hypertension Mother   . Cancer Sister   . Breast cancer Sister   . Neuropathy Sister        "that's because she drinks"    Social History   Tobacco Use  . Smoking status: Never Smoker  . Smokeless tobacco: Never Used  Vaping Use  . Vaping Use: Never used  Substance Use Topics  . Alcohol use: Never    Alcohol/week: 0.0 standard drinks  . Drug use: Never    Home Medications Prior to Admission medications   Medication Sig Start Date End Date Taking? Authorizing Provider  Calcium Carb-Cholecalciferol (CALCIUM 600+D3 PO) Take 1 tablet by mouth daily.    [provider]  GLUCOSAMINE-CHONDROITIN PO Take 2 tablets by mouth daily. Glucosamine 1500 mg and Chrondroitin 1200 mg.    [provider]    Allergies    Patient has no known allergies.  Review of Systems   Review of Systems  Neurological: Positive for dizziness.  All other systems reviewed and are  negative.   Physical Exam Updated Vital Signs BP 139/83   Pulse 68   Temp 98 F (36.7 C) (Oral)   Resp 18   Ht 5\' 8"  (1.727 m)   Wt 95.3 kg   SpO2 100%   BMI 31.93 kg/m   Physical Exam Vitals and nursing note reviewed.  Constitutional:      Appearance: Normal appearance.  HENT:     Head: Normocephalic and atraumatic.     Right Ear: External ear normal.     Left Ear: External ear normal.     Nose: Nose normal.     Mouth/Throat:     Mouth: Mucous membranes are moist.     Pharynx: Oropharynx is clear.  Eyes:     Extraocular Movements: Extraocular movements intact.     Conjunctiva/sclera: Conjunctivae normal.     Pupils: Pupils are equal, round, and reactive to light.  Cardiovascular:     Rate and Rhythm: Normal rate and regular rhythm.     Pulses: Normal pulses.     Heart sounds: Normal heart sounds.  Pulmonary:     Effort: Pulmonary effort is normal.     Breath sounds: Normal breath sounds.  Abdominal:     General: Abdomen is  flat. Bowel sounds are normal.     Palpations: Abdomen is soft.  Musculoskeletal:        General: Normal range of motion.     Cervical back: Normal range of motion and neck supple.  Skin:    General: Skin is warm.     Capillary Refill: Capillary refill takes less than 2 seconds.  Neurological:     General: No focal deficit present.     Mental Status: She is alert and oriented to person, place, and time.  Psychiatric:        Mood and Affect: Mood normal.        Behavior: Behavior normal.     ED Results / Procedures / Treatments   Labs (all labs ordered are listed, but only abnormal results are displayed) Labs Reviewed  COMPREHENSIVE METABOLIC PANEL  CBC WITH DIFFERENTIAL/PLATELET  TROPONIN I (HIGH SENSITIVITY)  TROPONIN I (HIGH SENSITIVITY)    EKG EKG Interpretation  Date/Time:  Tuesday November 04 2020 12:37:20 EDT Ventricular Rate:  64 PR Interval:    QRS Duration: 91 QT Interval:  411 QTC Calculation: 424 R  Axis:   47 Text Interpretation: Normal sinus rhythm Low voltage, precordial leads 12 Lead; Mason-Likar No significant change since last tracing Confirmed by Dorie Rank [phone]) on 11/04/2020 12:40:44 PM   Radiology CT Head Wo Contrast  Result Date: 11/04/2020 CLINICAL DATA:  Altered mental status, dizziness since yesterday EXAM: CT HEAD WITHOUT CONTRAST TECHNIQUE: Contiguous axial images were obtained from the base of the skull through the vertex without intravenous contrast. COMPARISON:  Head CT October 07, 2008 FINDINGS: Brain: No evidence of acute large vascular territory infarction, hemorrhage, hydrocephalus, extra-axial collection or mass lesion/mass effect. Vascular: No hyperdense vessel or unexpected calcification. Skull: Normal. Negative for fracture or focal lesion. Sinuses/Orbits: Paranasal sinuses and mastoid air cells are predominantly clear. Orbits are grossly unremarkable. Other: None IMPRESSION: No acute intracranial abnormality. Electronically Signed   By: Dahlia Bailiff MD   On: 11/04/2020 16:57    Procedures Procedures   Medications Ordered in ED Medications  sodium chloride 0.9 % bolus 1,000 mL (1,000 mLs Intravenous New Bag/Given 11/04/20 1704)    ED Course  I have reviewed the triage vital signs and the nursing notes.  Pertinent labs & imaging results that were available during my care of the patient were reviewed by me and considered in my medical decision making (see chart for details).    MDM Rules/Calculators/A&P                          Pt feels much better.  Work up unremarkable.  Pt is stable for d/c.  She knows to return if worse.  Final Clinical Impression(s) / ED Diagnoses Final diagnoses:  Dizziness    Rx / DC Orders ED Discharge Orders    None       Isla Pence, MD 11/04/20 [phone]

## 2020-11-04 NOTE — ED Triage Notes (Signed)
Pt c/o dizziness/lightheadedness and mild headache x 1 day. States she checked bp and it was elevated in 160s, does not take bp meds. Pt denies pain, photosensitivity or hx of migraines

## 2020-12-01 ENCOUNTER — Encounter: Payer: Self-pay | Admitting: Family Medicine

## 2020-12-01 DIAGNOSIS — Z1211 Encounter for screening for malignant neoplasm of colon: Secondary | ICD-10-CM

## 2020-12-12 ENCOUNTER — Other Ambulatory Visit: Payer: Self-pay

## 2020-12-12 ENCOUNTER — Ambulatory Visit: Payer: 59 | Attending: Neurology | Admitting: Physical Therapy

## 2020-12-12 ENCOUNTER — Encounter: Payer: Self-pay | Admitting: Physical Therapy

## 2020-12-12 DIAGNOSIS — M6281 Muscle weakness (generalized): Secondary | ICD-10-CM | POA: Diagnosis present

## 2020-12-12 DIAGNOSIS — R201 Hypoesthesia of skin: Secondary | ICD-10-CM | POA: Diagnosis not present

## 2020-12-12 DIAGNOSIS — M79604 Pain in right leg: Secondary | ICD-10-CM | POA: Diagnosis present

## 2020-12-12 NOTE — Addendum Note (Signed)
Addended by: Arliss Journey on: 12/12/2020 10:18 AM   Modules accepted: Orders

## 2020-12-12 NOTE — Therapy (Signed)
[facility] [address] [place], [place], [address] Phone: [phone]   Fax:  [phone]  Physical Therapy Evaluation  Patient Details  Name: JERZIE BIERI MRN: [id number] Date of Birth: 1965-09-26 Referring Provider (PT): Dr. Jaynee Eagles   Encounter Date: 12/12/2020   PT End of Session - 12/12/20 0943    Visit Number 1    Number of Visits 9    Authorization Type UHC - 60 VL, soft max.    PT Start Time [id number]    PT Stop Time 0925    PT Time Calculation (min) 44 min    Activity Tolerance Patient tolerated treatment well    Behavior During Therapy [facility] for tasks assessed/performed           Past Medical History:  Diagnosis Date  . Colon polyps     Past Surgical History:  Procedure Laterality Date  . ABDOMINAL HYSTERECTOMY    . KNEE SURGERY      There were no vitals filed for this visit.    Subjective Assessment - 12/12/20 0844    Subjective Having tingling/numbness in RLE down to her foot and also some in her thighs (but not has bad) and has pain in L hip. No pain in low back. Has trouble crossing her L leg over her right - has pain in her groin area when she does this. Calf pain wakes her up at night. Started in 2019 in her foot and it traveled up the leg. Its gotten worse and hurts everyday. Reports sitting makes it worse (has a sedentary job), elevating the leg makes the pain better. Bending over to touch her toes also makes it feel better. Also very painful to do a squat. Wants to get back to walking - only walking on her good days. Was walking 3-5 miles before (last time doing that was July of last year). Trying to do yoga, it is very difficult as it will act up at times. Mornings are the worst. Reports lying supine also makes it feel better.    Pertinent History pre-diabetes    Limitations Sitting;Walking    Diagnostic tests xray of lumbar spine in 2019; IMPRESSION:  Moderate degenerative disc disease is noted  at T12-L1. No acute  abnormality seen in the lumbar spine.    Patient Stated Goals to get back to walking, do a squat and be able to pick up something from the ground without pain    Currently in Pain? No/denies              [facility] PT Assessment - 12/12/20 0850      Assessment   Medical Diagnosis RLE numbness/tingling    Referring Provider (PT) Dr. Jaynee Eagles    Onset Date/Surgical Date 09/09/20   date of referral, started back in 2019   Prior Therapy never had PT before      Balance Screen   Has the patient fallen in the past 6 months No    Has the patient had a decrease in activity level because of a fear of falling?  No    Is the patient reluctant to leave their home because of a fear of falling?  No      Home Environment   Living Environment Private residence    Type of [place] to enter    Entrance Stairs-Number of Steps 4    Entrance Stairs-Rails Can reach both    [place] One level  Additional Comments has incr difficulty with stairs      Prior Function   Level of Independence Independent    Vocation Full time employment    Vocation Requirements works at [employer] riding and walking - no pain when she is bike riding      Engineer, production [employer]    Additional Comments more numbness/tingling in RLE, but none present during eval      Coordination   Gross Motor Movements are Fluid and Coordinated No    Heel Shin Test harder to perform with LLE      ROM / Strength   AROM / PROM / Strength Strength;AROM      AROM   Overall AROM Comments more limited hip IR/ER LLE compared to RLE. with repeated lumbar extension - pt reports burning sx went from feet/lower leg up to her thigh. feels pain in groin with L hip flexion    AROM Assessment Site Lumbar;Hip    Lumbar Flexion WFL   can touch fingers to floor, reports relief   Lumbar Extension WFL    Lumbar - Right Side Bend WFL   reports tightness in sides, but feels good    Lumbar - Left Side Bend WFL   reports tightness in sides, but feels good     Strength   Strength Assessment Site Hip;Ankle;Knee    Right/Left Hip Left;Right    Right Hip Flexion 4+/5    Right Hip Extension 3+/5    Right Hip External Rotation  4+/5    Right Hip Internal Rotation 3/5   had an incr spasm when performing, could not hold against resistance   Right Hip ABduction 5/5    Left Hip Flexion 4/5    Left Hip Extension 4/5    Left Hip External Rotation 4/5    Left Hip Internal Rotation 4+/5    Left Hip ABduction 4/5    Right/Left Knee Right;Left    Right Knee Flexion 5/5    Right Knee Extension 4+/5    Left Knee Flexion 5/5    Left Knee Extension 4+/5    Right/Left Ankle Right;Left    Right Ankle Dorsiflexion 5/5    Right Ankle Plantar Flexion 4-/5    Right Ankle Inversion 5/5    Right Ankle Eversion 5/5    Left Ankle Dorsiflexion 5/5    Left Ankle Plantar Flexion 4/5    Left Ankle Inversion 5/5    Left Ankle Eversion 5/5      Special Tests    Special Tests Lumbar    Lumbar Tests Straight Leg Raise;Slump Test;Prone Knee Bend Test;FABER test      FABER test   findings Negative    Side --   B   Comment --      Slump test   Findings Negative    Side --   B   Comment no radicular sx      Prone Knee Bend Test   Findings Negative    Side --   b   Comment tigthness B      Straight Leg Raise   Findings Negative    Side  --   B   Comment approx. 80 deg B                      Objective measurements completed on examination: See above findings.  PT Education - 12/12/20 0942    Education Details clinical findings, POC. performing 10 reps of repeated lumbar extensions 2x daily due to relief/centralization of radicular sx.    Person(s) Educated Patient    Methods Explanation    Comprehension Verbalized understanding            PT Short Term Goals - 12/12/20 0955      PT SHORT TERM GOAL #1   Title ALL STGS = LTGS              PT Long Term Goals - 12/12/20 0955      PT LONG TERM GOAL #1   Title Pt will be independent with HEP for strength/ROM/and flexibility. ALL LTGS DUE 01/09/21    Time 4    Period Weeks    Status New    Target Date 01/09/21      PT LONG TERM GOAL #2   Title Will perform 6MWT for walking endurance with goal to be written as appropriate.    Time 4    Period Weeks    Status New      PT LONG TERM GOAL #3   Title Pt will be able to perform a functional squat to pick an object off of the ground with no reports of pain.    Time 4    Period Weeks    Status New      PT LONG TERM GOAL #4   Title Pt will be able to perform the 4 steps going up/down with/without the handrail to her house with no reports of pain down RLE in order to improve functional mobility.    Time 4    Period Weeks    Status New                  Plan - 12/12/20 1000    Clinical Impression Statement Patient is a 55 year old female referred to Neuro OPPT for RLE numbness/tingling. Pt's pain is worse in calf and goes up to thigh, but not to low back.  Pt's PMH is significant for: pre-diabetes. Pt also reports tightness in L hip.  Pain is worse with sitting and walking for longer distances.  The following deficits were present during the exam: decr strength, impaired flexibility, impaired sensation RLE, impaired coordination with LLE, decr AROM (L hip flexion, IR, and ER). Lumbar special tests were negative, including SLUMP and straight leg test. With repeated lumbar extension in standing x10 reps, pt's sx centralized from R foot/calf to R thigh.  PT to address these impairments and functional limitations to maximize functional mobility independence    Personal Factors and Comorbidities Comorbidity 1    Comorbidities pre-diabetes    Examination-Activity Limitations Squat;Stairs;Sit    Examination-Participation Restrictions --   walking long distances   Stability/Clinical Decision Making Stable/Uncomplicated     Clinical Decision Making Low    Rehab Potential Excellent    PT Frequency 2x / week    PT Duration 4 weeks    PT Treatment/Interventions ADLs/Self Care Home Management;Aquatic Therapy;Gait training;Stair training;Functional mobility training;Therapeutic activities;Neuromuscular re-education;Balance training;Therapeutic exercise;Patient/family education;Manual techniques;Passive range of motion    PT Next Visit Plan perform 6MWT for walking endurance and write goal. initial HEP - pt responded well to repeated extension with sx centralizing, hip IR/ER and hip ADD stretching, hip strengthening, work on core stabilization.    Recommended Other Services possibly aquatics for an HEP? pt reports she swims and it helps a lot.    Consulted  and Agree with Plan of Care Patient           Patient will benefit from skilled therapeutic intervention in order to improve the following deficits and impairments:  Decreased activity tolerance,Decreased range of motion,Decreased strength,Impaired flexibility,Impaired sensation,Pain  Visit Diagnosis: Impaired sensation  Muscle weakness (generalized)  Pain in right leg     Problem List Patient Active Problem List   Diagnosis Date Noted  . Numbness and tingling of right leg 09/09/2020  . Pre-diabetes 05/14/2015  . History of cervical cancer 02/26/2015  . Abnormal Pap smear of cervix 07/24/2014    Arliss Journey, PT, DPT  12/12/2020, 10:17 AM  [facility] [address] [place], [place], [address] Phone: [phone]   Fax:  [phone]  Name: AYDIA MAJ MRN: [id number] Date of Birth: Aug 14, 1965

## 2020-12-16 ENCOUNTER — Ambulatory Visit: Payer: 59

## 2020-12-16 ENCOUNTER — Other Ambulatory Visit: Payer: Self-pay

## 2020-12-16 DIAGNOSIS — M79604 Pain in right leg: Secondary | ICD-10-CM

## 2020-12-16 DIAGNOSIS — M6281 Muscle weakness (generalized): Secondary | ICD-10-CM

## 2020-12-16 DIAGNOSIS — R201 Hypoesthesia of skin: Secondary | ICD-10-CM | POA: Diagnosis not present

## 2020-12-16 NOTE — Patient Instructions (Signed)
Access Code: [email or web address] URL: [email or web address] Date: 12/16/2020 Prepared by: Cherly Anderson  Exercises Prone Press Up On Elbows - 3 x daily - 7 x weekly - 1 sets - 10 reps Supine March - 2 x daily - 7 x weekly - 1 sets - 10 reps Bent knee fallout - 2 x daily - 7 x weekly - 1 sets - 10 reps Supine Bridge - 2 x daily - 7 x weekly - 1 sets - 10 reps

## 2020-12-16 NOTE — Therapy (Signed)
[facility] [address] [place], [place], [address] Phone: [phone]   Fax:  [phone]  Physical Therapy Treatment  Patient Details  Name: Hailey Bryant MRN: [id number] Date of Birth: [birth year] Referring Provider (PT): Dr. Jaynee Eagles   Encounter Date: 12/16/2020   PT End of Session - 12/16/20 0930    Visit Number 2    Number of Visits 9    Authorization Type UHC - 60 VL, soft max.    PT Start Time [phone]    PT Stop Time 1015    PT Time Calculation (min) 47 min    Activity Tolerance Patient tolerated treatment well    Behavior During Therapy WFL for tasks assessed/performed           Past Medical History:  Diagnosis Date  . Colon polyps     Past Surgical History:  Procedure Laterality Date  . ABDOMINAL HYSTERECTOMY    . KNEE SURGERY      There were no vitals filed for this visit.   Subjective Assessment - 12/16/20 0930    Subjective Pt reports that she is doing well. She has been working on the extensions and does help with the pain when she performs. Does seems better. No pain currently but does have some numbness in posterior right calf.    Pertinent History pre-diabetes    Limitations Sitting;Walking    Diagnostic tests xray of lumbar spine in 2019; IMPRESSION:  Moderate degenerative disc disease is noted at T12-L1. No acute  abnormality seen in the lumbar spine.    Patient Stated Goals to get back to walking, do a squat and be able to pick up something from the ground without pain    Currently in Pain? No/denies              [facility] PT Assessment - 12/16/20 0935      6 Minute Walk- Baseline   6 Minute Walk- Baseline yes      6 minute walk test results    Aerobic Endurance Distance Walked 1190    Endurance additional comments after 3 minutes pt developed numbness in right posterior calf, at 4 min 4/10 pain in left anterior groin. At end lumbar flexion with hamstring stretch decreased numbness.                          [facility] Adult PT Treatment/Exercise - 12/16/20 0935      Ambulation/Gait   Ambulation/Gait Yes    Ambulation/Gait Assistance 7: Independent    Ambulation/Gait Assistance Details Pt has decreased hip extension on left.    Ambulation Distance (Feet) 1190 Feet   during 6 min walk   Assistive device None    Gait Pattern Step-through pattern;Decreased step length - right      Therapeutic Activites    Therapeutic Activities Other Therapeutic Activities    Other Therapeutic Activities PT discussed having height of chair at her desk adjusted so that there was 90 degree angle at hips and knees. Also suggested lumbar roll to help with posture if needed. Pt to stand every 20-30 minutes for at least a couple minutes as does have desk that she can raise to stand at.      Exercises   Exercises Other Exercises    Other Exercises  Hooklying: Transverse abdominus (TA) contraction after instruction with pt palpating for contraction. Able to ellicit contraction. Verbal cues to not hold breath. Marching with TA contraction x 10.  Pt had more difficulty raising LLE reporting it feeling weaker. PT assessed passive hip flexion and more limited on left as well compared to right. Left bent knee fall out with TA contraction x 10. Bridges x 10 with TA contraction. Left heel slides keeping foot off mat x 10. Pt denied any pain with performance of exercises but felt weak on left.      Manual Therapy   Manual Therapy Joint mobilization;Passive ROM    Manual therapy comments PT performed passive stretching to left hip in prone for IR and ER within pain free range for IR as pt does report pain in anterior hip with increasing range. Performed 20 sec x 3 each way. Pt has limited ROM compared to right. No pain or restrictions noted on right.    Joint Mobilization PT assessed lumbar spinal mobility in prone L1-S-2. Pt hypermobile around L5, S1 area with reported discomfort with PA mob at this  level. No pain with unilateral mobs around this area. PT performed grade 2 unilateral mobs at L5, S1 for pain. With repeated extension on elbows x 10 in prone pt was noted to have all movement at this area. Pt reported no issues in leg with repeated extension. Prone PA hip mobs 3 bouts of 20 sec on left. Hypomobile with some discomfort. Assessed on right with no issues noted. Hooklying inferior glides to left hip 2 bouts of 20 sec decreased pain at left groin.                  PT Education - 12/16/20 1029    Education Details PT discussed findings during todays session with pt having decreased motion at left hip, increased motion on right. Also all movement from one place at back. Started on HEP to continue prone press up on elbow and added in core stabilization. Also discussed seating recommendations for home work space (see therapeutic activity session) and standing every 20-30 min.    Person(s) Educated Patient    Methods Explanation;Demonstration;Handout    Comprehension Verbalized understanding;Returned demonstration            PT Short Term Goals - 12/12/20 0955      PT SHORT TERM GOAL #1   Title ALL STGS = LTGS             PT Long Term Goals - 12/16/20 1032      PT LONG TERM GOAL #1   Title Pt will be independent with HEP for strength/ROM/and flexibility. ALL LTGS DUE 01/09/21    Time 4    Period Weeks    Status New      PT LONG TERM GOAL #2   Title Pt will increase 6 min walk from 1190' to >1300' with minimal complaints of numbness in right calf and <4/10 pain in left groin for improved function.    Baseline 12/16/20 1190' with numbness in right calf after 3 min and 4/10 pain in left groin after 4 min.    Time 4    Period Weeks    Status Revised      PT LONG TERM GOAL #3   Title Pt will be able to perform a functional squat to pick an object off of the ground with no reports of pain.    Time 4    Period Weeks    Status New      PT LONG TERM GOAL #4   Title  Pt will be able to perform the 4 steps going up/down with/without  the handrail to her house with no reports of pain down RLE in order to improve functional mobility.    Time 4    Period Weeks    Status New                 Plan - 12/16/20 1035    Clinical Impression Statement PT focused on trying to improve left hip mobility with manual techniques and stretching. Pt is getting most movement in back from L5-S1 area and has excessive right hip movement compared to left. Started on core stabilization which was challenging when moving LLE. Pt did develop symptoms with 6 min walk today in right calf and left groin.    Personal Factors and Comorbidities Comorbidity 1    Comorbidities pre-diabetes    Examination-Activity Limitations Squat;Stairs;Sit    Examination-Participation Restrictions --   walking long distances   Stability/Clinical Decision Making Stable/Uncomplicated    Rehab Potential Excellent    PT Frequency 2x / week    PT Duration 4 weeks    PT Treatment/Interventions ADLs/Self Care Home Management;Aquatic Therapy;Gait training;Stair training;Functional mobility training;Therapeutic activities;Neuromuscular re-education;Balance training;Therapeutic exercise;Patient/family education;Manual techniques;Passive range of motion    PT Next Visit Plan How is initial HEP going? Continue repeated extension with sx centralizing, left hip IR/ER and hip ADD stretching and manual techniques (hip distraction on left helped)  hip strengthening, work on core stabilization.    Consulted and Agree with Plan of Care Patient           Patient will benefit from skilled therapeutic intervention in order to improve the following deficits and impairments:  Decreased activity tolerance,Decreased range of motion,Decreased strength,Impaired flexibility,Impaired sensation,Pain  Visit Diagnosis: Muscle weakness (generalized)  Pain in right leg     Problem List Patient Active Problem List    Diagnosis Date Noted  . Numbness and tingling of right leg 09/09/2020  . Pre-diabetes 05/14/2015  . History of cervical cancer 02/26/2015  . Abnormal Pap smear of cervix 07/24/2014    Electa Sniff, PT, DPT, NCS 12/16/2020, 10:40 AM  [facility] [address] [place], [place], [address] Phone: [phone]   Fax:  [phone]  Name: ENISA RUNYAN MRN: [id number] Date of Birth: 05-15-1966

## 2020-12-18 ENCOUNTER — Ambulatory Visit: Payer: 59 | Admitting: Physical Therapy

## 2020-12-18 ENCOUNTER — Encounter: Payer: Self-pay | Admitting: Physical Therapy

## 2020-12-18 ENCOUNTER — Other Ambulatory Visit: Payer: Self-pay

## 2020-12-18 DIAGNOSIS — M6281 Muscle weakness (generalized): Secondary | ICD-10-CM

## 2020-12-18 DIAGNOSIS — R201 Hypoesthesia of skin: Secondary | ICD-10-CM | POA: Diagnosis not present

## 2020-12-18 DIAGNOSIS — M79604 Pain in right leg: Secondary | ICD-10-CM

## 2020-12-18 NOTE — Therapy (Signed)
[facility] [address] [place], [place], [address] Phone: [phone]   Fax:  [phone]  Physical Therapy Treatment  Patient Details  Name: Hailey Bryant MRN: [id number] Date of Birth: 1965-09-06 Referring Provider (PT): Dr. Jaynee Eagles   Encounter Date: 12/18/2020   PT End of Session - 12/18/20 1208    Visit Number 3    Number of Visits 9    Authorization Type UHC - 60 VL, soft max.    PT Start Time 0803    PT Stop Time 0845    PT Time Calculation (min) 42 min    Activity Tolerance Patient tolerated treatment well    Behavior During Therapy WFL for tasks assessed/performed           Past Medical History:  Diagnosis Date  . Colon polyps     Past Surgical History:  Procedure Laterality Date  . ABDOMINAL HYSTERECTOMY    . KNEE SURGERY      There were no vitals filed for this visit.   Subjective Assessment - 12/18/20 0805    Subjective Exercises are going well at home. The bridges are tough. Still having some numbness in posterior R calf. No pain though.    Pertinent History pre-diabetes    Limitations Sitting;Walking    Diagnostic tests xray of lumbar spine in 2019; IMPRESSION:  Moderate degenerative disc disease is noted at T12-L1. No acute  abnormality seen in the lumbar spine.    Patient Stated Goals to get back to walking, do a squat and be able to pick up something from the ground without pain    Currently in Pain? No/denies                             [facility] Adult PT Treatment/Exercise - 12/18/20 0809      Exercises   Exercises Knee/Hip    Other Exercises  hooklying positon: cues for core stab while performing, bent knee fall outs x10 reps each leg (needing cues for proper technique) - pt reports difficulty performing when trying to keep LLE still but no pain, bridging x10 reps - cues first for TA activation. butterfly stretch - 3 x 30 seconds to pt's tolerance with pt having incr  ROM with each rep, gentle lower trunk rotations x5 reps B and holding at end range for stretch, in hooklying bringing feet out and then knees together for hip IR stretch 2 x 30 seconds. x10 reps prone press ups with elbows on table     Knee/Hip Exercises: Aerobic   Stepper SciFit with BUE/BLE for 5 minutes at gear 2.5 for strengthening, ROM. Pt reporting LLE feeling better afterwards      Manual Therapy   Manual Therapy Joint mobilization;Passive ROM    Joint Mobilization hooklying inferior glides to L hip 4 bouts of 20 seconds each with pt reporting relief, with LLE extended-gentle distraction 4 x 15 second bouts, central PA mobs to L5/S1- beginning at grade II for pain 3 bouts of 20 seconds and then progressing to grade III for stiffness and then 3 bouts of 20 seconds.     Passive ROM with pt supine - therapist stretching pt into L IR 3 x30 seconds and ER 3 x 30 seconds to pt's tolerance                   PT Short Term Goals - 12/12/20 0955      PT  SHORT TERM GOAL #1   Title ALL STGS = LTGS             PT Long Term Goals - 12/16/20 1032      PT LONG TERM GOAL #1   Title Pt will be independent with HEP for strength/ROM/and flexibility. ALL LTGS DUE 01/09/21    Time 4    Period Weeks    Status New      PT LONG TERM GOAL #2   Title Pt will increase 6 min walk from 1190' to >1300' with minimal complaints of numbness in right calf and <4/10 pain in left groin for improved function.    Baseline 12/16/20 1190' with numbness in right calf after 3 min and 4/10 pain in left groin after 4 min.    Time 4    Period Weeks    Status Revised      PT LONG TERM GOAL #3   Title Pt will be able to perform a functional squat to pick an object off of the ground with no reports of pain.    Time 4    Period Weeks    Status New      PT LONG TERM GOAL #4   Title Pt will be able to perform the 4 steps going up/down with/without the handrail to her house with no reports of pain down RLE in  order to improve functional mobility.    Time 4    Period Weeks    Status New                 Plan - 12/18/20 1209    Clinical Impression Statement Continued to improve L hip mobility with manual techniques and stretching for hip ADD/internal and external rotation .Continued with core stabilization with pt needing cues for proper technique for bent knee fall outs. At end of session pt reporting no numbness/tingling in RLE.    Personal Factors and Comorbidities Comorbidity 1    Comorbidities pre-diabetes    Examination-Activity Limitations Squat;Stairs;Sit    Examination-Participation Restrictions --   walking long distances   Stability/Clinical Decision Making Stable/Uncomplicated    Rehab Potential Excellent    PT Frequency 2x / week    PT Duration 4 weeks    PT Treatment/Interventions ADLs/Self Care Home Management;Aquatic Therapy;Gait training;Stair training;Functional mobility training;Therapeutic activities;Neuromuscular re-education;Balance training;Therapeutic exercise;Patient/family education;Manual techniques;Passive range of motion    PT Next Visit Plan Continue repeated extension with sx centralizing, left hip IR/ER and hip ADD stretching and manual techniques (hip distraction on left helped)  hip strengthening, continue to work on core stabilization.    Consulted and Agree with Plan of Care Patient           Patient will benefit from skilled therapeutic intervention in order to improve the following deficits and impairments:  Decreased activity tolerance,Decreased range of motion,Decreased strength,Impaired flexibility,Impaired sensation,Pain  Visit Diagnosis: Muscle weakness (generalized)  Pain in right leg  Impaired sensation     Problem List Patient Active Problem List   Diagnosis Date Noted  . Numbness and tingling of right leg 09/09/2020  . Pre-diabetes 05/14/2015  . History of cervical cancer 02/26/2015  . Abnormal Pap smear of cervix 07/24/2014     Arliss Journey, PT, DPT  12/18/2020, 12:14 PM  [facility] [address] [place], [place], [address] Phone: [phone]   Fax:  [phone]  Name: Hailey Bryant MRN: [id number] Date of Birth: [birth year]

## 2020-12-22 ENCOUNTER — Ambulatory Visit: Payer: 59 | Admitting: Physical Therapy

## 2020-12-22 ENCOUNTER — Other Ambulatory Visit: Payer: Self-pay

## 2020-12-22 ENCOUNTER — Encounter: Payer: Self-pay | Admitting: Physical Therapy

## 2020-12-22 DIAGNOSIS — R201 Hypoesthesia of skin: Secondary | ICD-10-CM | POA: Diagnosis not present

## 2020-12-22 DIAGNOSIS — M79604 Pain in right leg: Secondary | ICD-10-CM

## 2020-12-22 DIAGNOSIS — M6281 Muscle weakness (generalized): Secondary | ICD-10-CM

## 2020-12-22 NOTE — Therapy (Signed)
[facility] [address] [place], [place], [address] Phone: [phone]   Fax:  [phone]  Physical Therapy Treatment  Patient Details  Name: Hailey Bryant MRN: [id number] Date of Birth: 1965/10/26 Referring Provider (PT): Dr. Jaynee Eagles   Encounter Date: 12/22/2020   PT End of Session - 12/22/20 1217    Visit Number 4    Number of Visits 9    Authorization Type U[employer] - 60 VL, soft max.    PT Start Time [id number]    PT Stop Time 1012    PT Time Calculation (min) 41 min    Activity Tolerance Patient tolerated treatment well    Behavior During Therapy WFL for tasks assessed/performed           Past Medical History:  Diagnosis Date  . Colon polyps     Past Surgical History:  Procedure Laterality Date  . ABDOMINAL HYSTERECTOMY    . KNEE SURGERY      There were no vitals filed for this visit.   Subjective Assessment - 12/22/20 0933    Subjective RLE is feeling better, was gardening over the weekend and it felt better with less pain. Had a good birthday.    Pertinent History pre-diabetes    Limitations Sitting;Walking    Diagnostic tests xray of lumbar spine in 2019; IMPRESSION:  Moderate degenerative disc disease is noted at T12-L1. No acute  abnormality seen in the lumbar spine.    Patient Stated Goals to get back to walking, do a squat and be able to pick up something from the ground without pain    Currently in Pain? No/denies                             [facility] Adult PT Treatment/Exercise - 12/22/20 0935      Exercises   Exercises Other Exercises    Other Exercises  hooklying: working on TA activation - 2 x 5 reps B alternating marching (incr difficulty with lifting LLE), x5 reps B alternating heel slides, x10 reps gentle ball squeeze  with cues for TA activation throughout     Knee/Hip Exercises: Aerobic   Stepper SciFit with BUE/BLE for 5 minutes at gear 3.0 for strengthening, ROM, pt  reporting it felt good for LLE afterwards.      Manual Therapy   Manual Therapy Joint mobilization;Passive ROM    Joint Mobilization hooklying: inferior glides to L hip - 4 bouts of 20 seconds, with pt reporting feeling relief in LLE, lateral distraction (with belt) - 2 x 20 seconds and 3 x 20 seconds oscillating.    Passive ROM with pt prone- performed rectus femoris MET to LLE 3 x 30 second bouts to pt's tolerance, gentle stretching into IR 3 x 20 seconds, stretching into ER  3 x 20 seconds (more limited in ER) - then gentle PROM into and out of external rotation and internal rotation for LLE x5 reps each direction                    PT Short Term Goals - 12/12/20 0955      PT SHORT TERM GOAL #1   Title ALL STGS = LTGS             PT Long Term Goals - 12/16/20 1032      PT LONG TERM GOAL #1   Title Pt will be independent with HEP for strength/ROM/and flexibility.  ALL LTGS DUE 01/09/21    Time 4    Period Weeks    Status New      PT LONG TERM GOAL #2   Title Pt will increase 6 min walk from 1190' to >1300' with minimal complaints of numbness in right calf and <4/10 pain in left groin for improved function.    Baseline 12/16/20 1190' with numbness in right calf after 3 min and 4/10 pain in left groin after 4 min.    Time 4    Period Weeks    Status Revised      PT LONG TERM GOAL #3   Title Pt will be able to perform a functional squat to pick an object off of the ground with no reports of pain.    Time 4    Period Weeks    Status New      PT LONG TERM GOAL #4   Title Pt will be able to perform the 4 steps going up/down with/without the handrail to her house with no reports of pain down RLE in order to improve functional mobility.    Time 4    Period Weeks    Status New                 Plan - 12/22/20 1222    Clinical Impression Statement Continued to work on improving L hip mobility with gentle stretching and mobilizations. Pt reporting relief from  hookyling inferior and lateral distraction to L hip. Pt with decr L hip ER>IR in prone - perfromed stretching to pt's tolerance but pt did report incr L anterior hip discomfort with more ROM. Continued to work on core strengthening with TA activation - working on alternating between RLE/LLE. Will continue to progress towards LTGs.    Personal Factors and Comorbidities Comorbidity 1    Comorbidities pre-diabetes    Examination-Activity Limitations Squat;Stairs;Sit    Examination-Participation Restrictions --   walking long distances   Stability/Clinical Decision Making Stable/Uncomplicated    Rehab Potential Excellent    PT Frequency 2x / week    PT Duration 4 weeks    PT Treatment/Interventions ADLs/Self Care Home Management;Aquatic Therapy;Gait training;Stair training;Functional mobility training;Therapeutic activities;Neuromuscular re-education;Balance training;Therapeutic exercise;Patient/family education;Manual techniques;Passive range of motion    PT Next Visit Plan Continue repeated extension- pt's symptoms are improving - maybe try full press up if pt can tolerate, left hip IR/ER and hip ADD stretching to pt's tolerance and manual techniques (hip distraction on left helped),  hip strengthening, continue to work on core stabilization.    Consulted and Agree with Plan of Care Patient           Patient will benefit from skilled therapeutic intervention in order to improve the following deficits and impairments:  Decreased activity tolerance,Decreased range of motion,Decreased strength,Impaired flexibility,Impaired sensation,Pain  Visit Diagnosis: Pain in right leg  Muscle weakness (generalized)  Impaired sensation     Problem List Patient Active Problem List   Diagnosis Date Noted  . Numbness and tingling of right leg 09/09/2020  . Pre-diabetes 05/14/2015  . History of cervical cancer 02/26/2015  . Abnormal Pap smear of cervix 07/24/2014    Arliss Journey, PT, DPT   12/22/2020, 12:26 PM  [facility] [address] [place], [place], [address] Phone: [phone]   Fax:  [phone]  Name: Hailey Bryant MRN: [id number] Date of Birth: [birth year]

## 2020-12-25 ENCOUNTER — Other Ambulatory Visit: Payer: Self-pay

## 2020-12-25 ENCOUNTER — Ambulatory Visit: Payer: 59

## 2020-12-25 DIAGNOSIS — M79604 Pain in right leg: Secondary | ICD-10-CM

## 2020-12-25 DIAGNOSIS — R201 Hypoesthesia of skin: Secondary | ICD-10-CM | POA: Diagnosis not present

## 2020-12-25 DIAGNOSIS — M6281 Muscle weakness (generalized): Secondary | ICD-10-CM

## 2020-12-25 NOTE — Therapy (Signed)
[facility] [address] [place], [place], [address] Phone: [phone]   Fax:  [phone]  Physical Therapy Treatment  Patient Details  Name: TED LEONHART MRN: [id number] Date of Birth: [birth year] Referring Provider (PT): Dr. Jaynee Eagles   Encounter Date: 12/25/2020   PT End of Session - 12/25/20 0851    Visit Number 5    Number of Visits 9    Authorization Type U[employer] - 60 VL, soft max.    PT Start Time 0850    PT Stop Time 0928    PT Time Calculation (min) 38 min    Activity Tolerance Patient tolerated treatment well    Behavior During Therapy [facility] for tasks assessed/performed           Past Medical History:  Diagnosis Date  . Colon polyps     Past Surgical History:  Procedure Laterality Date  . ABDOMINAL HYSTERECTOMY    . KNEE SURGERY      There were no vitals filed for this visit.   Subjective Assessment - 12/25/20 0851    Subjective Pt reports she is doing well. She is noticing decrease in numbness in right leg as well as less pain in left groin. She has been able to stand more and do some housework while working which is going well. Reports not having to do her stretching to relieve the numbness as much.    Pertinent History pre-diabetes    Limitations Sitting;Walking    Diagnostic tests xray of lumbar spine in 2019; IMPRESSION:  Moderate degenerative disc disease is noted at T12-L1. No acute  abnormality seen in the lumbar spine.    Patient Stated Goals to get back to walking, do a squat and be able to pick up something from the ground without pain    Currently in Pain? Yes    Pain Score 3     Pain Location Groin    Pain Orientation Left    Pain Descriptors / Indicators Aching    Pain Type Chronic pain    Pain Onset More than a month ago    Pain Frequency Intermittent    Aggravating Factors  trying to cross leg                             [facility] Adult PT Treatment/Exercise -  12/25/20 0853      Ambulation/Gait   Ambulation/Gait Yes    Ambulation/Gait Assistance 7: Independent    Ambulation/Gait Assistance Details Pt had improved left pelvic rotation today with better hip extension. Denied any pain    Ambulation Distance (Feet) 230 Feet    Assistive device None    Gait Pattern Step-through pattern    Ambulation Surface Level;Indoor      Exercises   Exercises Other Exercises    Other Exercises  Hooklying pelvic tilts x 10, bridges x 10, hip flexion x 10. Pt was cued to engage core throughout. Pt was able to increase left hip flexion today with better control. Step-ups on curb x 6 each side with tactile cues at pelvis to keep level. Pt had slight pain on first rep lowering back with LLE but improved as went on.      Knee/Hip Exercises: Aerobic   Stepper SciFit with BUE and BLE x 6 min level 3 for ROM.      Manual Therapy   Manual Therapy Joint mobilization    Joint Mobilization Hooklying left hip inferior  mobs with mobilization belt 3 bouts of 20 sec, lateral glides 3 bouts of 20 sec and combo inferior/lateral mobs with gradual increase in IR 3 bouts of 20 sec. Grade 3 mobs throughout. Prone PA left hip mobs 3 bouts of 20 sec.    Passive ROM MET in prone to hip flexors with pillow under thigh with 5 sec contract and 20 sec stretch. Pt denied any pain with performance.                  PT Education - 12/25/20 1803    Education Details Pt instructed to work on step-ups at home trying to keep pelvis level.    Person(s) Educated Patient    Methods Explanation;Demonstration    Comprehension Verbalized understanding;Returned demonstration            PT Short Term Goals - 12/12/20 0955      PT SHORT TERM GOAL #1   Title ALL STGS = LTGS             PT Long Term Goals - 12/16/20 1032      PT LONG TERM GOAL #1   Title Pt will be independent with HEP for strength/ROM/and flexibility. ALL LTGS DUE 01/09/21    Time 4    Period Weeks    Status New       PT LONG TERM GOAL #2   Title Pt will increase 6 min walk from 1190' to >1300' with minimal complaints of numbness in right calf and <4/10 pain in left groin for improved function.    Baseline 12/16/20 1190' with numbness in right calf after 3 min and 4/10 pain in left groin after 4 min.    Time 4    Period Weeks    Status Revised      PT LONG TERM GOAL #3   Title Pt will be able to perform a functional squat to pick an object off of the ground with no reports of pain.    Time 4    Period Weeks    Status New      PT LONG TERM GOAL #4   Title Pt will be able to perform the 4 steps going up/down with/without the handrail to her house with no reports of pain down RLE in order to improve functional mobility.    Time 4    Period Weeks    Status New                 Plan - 12/25/20 1806    Clinical Impression Statement Pt continues to report improvements in pain. She was able to demonstrate more left hip extension during gait today.    Personal Factors and Comorbidities Comorbidity 1    Comorbidities pre-diabetes    Examination-Activity Limitations Squat;Stairs;Sit    Examination-Participation Restrictions --   walking long distances   Stability/Clinical Decision Making Stable/Uncomplicated    Rehab Potential Excellent    PT Frequency 2x / week    PT Duration 4 weeks    PT Treatment/Interventions ADLs/Self Care Home Management;Aquatic Therapy;Gait training;Stair training;Functional mobility training;Therapeutic activities;Neuromuscular re-education;Balance training;Therapeutic exercise;Patient/family education;Manual techniques;Passive range of motion    PT Next Visit Plan Continue repeated extension- pt's symptoms are improving - maybe try full press up if pt can tolerate, left hip IR/ER and hip ADD stretching to pt's tolerance and manual techniques (hip distraction on left helped),  hip strengthening, continue to work on core stabilization.    Consulted and Agree with Plan of  Care Patient           Patient will benefit from skilled therapeutic intervention in order to improve the following deficits and impairments:  Decreased activity tolerance,Decreased range of motion,Decreased strength,Impaired flexibility,Impaired sensation,Pain  Visit Diagnosis: Pain in right leg  Muscle weakness (generalized)     Problem List Patient Active Problem List   Diagnosis Date Noted  . Numbness and tingling of right leg 09/09/2020  . Pre-diabetes 05/14/2015  . History of cervical cancer 02/26/2015  . Abnormal Pap smear of cervix 07/24/2014    Electa Sniff, PT, DPT, NCS 12/25/2020, 6:10 PM  [facility] [address] [place], [place], [address] Phone: [phone]   Fax:  [phone]  Name: ANTINETTE KEOUGH MRN: [id number] Date of Birth: 1966-01-08

## 2020-12-30 ENCOUNTER — Ambulatory Visit: Payer: 59

## 2021-01-01 ENCOUNTER — Encounter: Payer: Self-pay | Admitting: Physical Therapy

## 2021-01-01 ENCOUNTER — Ambulatory Visit: Payer: 59 | Attending: Neurology | Admitting: Physical Therapy

## 2021-01-01 ENCOUNTER — Other Ambulatory Visit: Payer: Self-pay

## 2021-01-01 DIAGNOSIS — R2689 Other abnormalities of gait and mobility: Secondary | ICD-10-CM | POA: Diagnosis present

## 2021-01-01 DIAGNOSIS — M6281 Muscle weakness (generalized): Secondary | ICD-10-CM | POA: Diagnosis present

## 2021-01-01 DIAGNOSIS — M79604 Pain in right leg: Secondary | ICD-10-CM | POA: Insufficient documentation

## 2021-01-01 NOTE — Therapy (Addendum)
[facility] [address] [place], [place], [address] Phone: [phone]   Fax:  [phone]  Physical Therapy Treatment  Patient Details  Name: Hailey Bryant MRN: [id number] Date of Birth: [birth year] Referring Provider (PT): Dr. Jaynee Eagles   Encounter Date: 01/01/2021   PT End of Session - 01/01/21 0845    Visit Number 6    Number of Visits 9    Authorization Type UHC - 60 VL, soft max.    PT Start Time 0801    PT Stop Time [phone]    PT Time Calculation (min) 42 min    Activity Tolerance Patient tolerated treatment well    Behavior During Therapy [facility] for tasks assessed/performed           Past Medical History:  Diagnosis Date  . Colon polyps     Past Surgical History:  Procedure Laterality Date  . ABDOMINAL HYSTERECTOMY    . KNEE SURGERY      There were no vitals filed for this visit.   Subjective Assessment - 01/01/21 0803    Subjective Went to [place] this past weekend, was super busy and on the go. Had a really bad day yesterday, the pain in the right leg was really bad, could barely do any walking. Reports just pain in the R thigh today and lower back area.    Pertinent History pre-diabetes    Limitations Sitting;Walking    Diagnostic tests xray of lumbar spine in 2019; IMPRESSION:  Moderate degenerative disc disease is noted at T12-L1. No acute  abnormality seen in the lumbar spine.    Patient Stated Goals to get back to walking, do a squat and be able to pick up something from the ground without pain    Pain Score 3     Pain Location Leg    Pain Orientation Right    Pain Descriptors / Indicators Sore    Pain Type Chronic pain    Pain Onset More than a month ago    Aggravating Factors  doing too much over the weekend                             [facility] Adult PT Treatment/Exercise - 01/01/21 0807      Exercises   Exercises Other Exercises    Other Exercises  Hooklying:  anterior/posterior pelvic tilts x10 reps with verbal and tactile cues for tehcnique, slow lower trunk rotations x8 reps B - with cues for TA activation when performing and holding at end range for additional stretch, alternating marching x7 reps B (pt with improvement in LLE ROM), alternating heel slides x5 reps B. in prone: x5 reps press ups on elbows, in R sidelying: x10 reps clamshells with LLE (pt reporting these felt good). x10 reps standing lumbar extension at countertop (pt reporting decr in radicular sx) - educated to continue performing at home as pt had stopped performing. attempted 3 mini squats with pt rating incr pain down RLE, pain subsiding after standing lumbar extensions     Knee/Hip Exercises: Aerobic   Stepper SciFit with BUE and BLE x 6 min level 3 for ROM. pt has a membership at [employer] - discussed with pt that she can also use these machines there with pt interested in this suggestion.      Manual Therapy   Manual Therapy Joint mobilization    Joint Mobilization In prone: grade II CPAs to L5/S1 4 bouts  of 20 seconds each, pt unable to tolerate grade III. Grade II R UPAs to L5 4 bouts of 20 seconds each. prone press up with gentle overpressure to L5/S1 x5 reps                  PT Education - 01/01/21 0845    Education Details performing standing repeated lumbar extensions - can also perform at countertop.    Person(s) Educated Patient    Methods Explanation;Demonstration    Comprehension Verbalized understanding;Returned demonstration            PT Short Term Goals - 12/12/20 0955      PT SHORT TERM GOAL #1   Title ALL STGS = LTGS             PT Long Term Goals - 12/16/20 1032      PT LONG TERM GOAL #1   Title Pt will be independent with HEP for strength/ROM/and flexibility. ALL LTGS DUE 01/09/21    Time 4    Period Weeks    Status New      PT LONG TERM GOAL #2   Title Pt will increase 6 min walk from 1190' to >1300' with minimal complaints of  numbness in right calf and <4/10 pain in left groin for improved function.    Baseline 12/16/20 1190' with numbness in right calf after 3 min and 4/10 pain in left groin after 4 min.    Time 4    Period Weeks    Status Revised      PT LONG TERM GOAL #3   Title Pt will be able to perform a functional squat to pick an object off of the ground with no reports of pain.    Time 4    Period Weeks    Status New      PT LONG TERM GOAL #4   Title Pt will be able to perform the 4 steps going up/down with/without the handrail to her house with no reports of pain down RLE in order to improve functional mobility.    Time 4    Period Weeks    Status New                 Plan - 01/01/21 1151    Clinical Impression Statement After central/unilateral grade II mobilizations to L5/S1 to help decr pain and repeated prone press ups and standing lumbar extensions, pt reporting no pain down RLE. Pt tried doing 3 mini squats with pain returning down leg. Afterwards pt performed standing lumbar extensions with relief of radicular sx. Will continue to progress towards LTGs.    Personal Factors and Comorbidities Comorbidity 1    Comorbidities pre-diabetes    Examination-Activity Limitations Squat;Stairs;Sit    Examination-Participation Restrictions --   walking long distances   Stability/Clinical Decision Making Stable/Uncomplicated    Rehab Potential Excellent    PT Frequency 2x / week    PT Duration 4 weeks    PT Treatment/Interventions ADLs/Self Care Home Management;Aquatic Therapy;Gait training;Stair training;Functional mobility training;Therapeutic activities;Neuromuscular re-education;Balance training;Therapeutic exercise;Patient/family education;Manual techniques;Passive range of motion    PT Next Visit Plan continue repeated extensions in standing. mobilizations to lower lumbar spine to decr stiffness/pain. left hip IR/ER and hip ADD stretching to pt's tolerance and manual techniques to L hip. hip  strengthening, continue to work on core stabilization.    Consulted and Agree with Plan of Care Patient           Patient will benefit  from skilled therapeutic intervention in order to improve the following deficits and impairments:  Decreased activity tolerance,Decreased range of motion,Decreased strength,Impaired flexibility,Impaired sensation,Pain  Visit Diagnosis: Pain in right leg  Muscle weakness (generalized)     Problem List Patient Active Problem List   Diagnosis Date Noted  . Numbness and tingling of right leg 09/09/2020  . Pre-diabetes 05/14/2015  . History of cervical cancer 02/26/2015  . Abnormal Pap smear of cervix 07/24/2014    Arliss Journey, PT, DPT  01/01/2021, 11:54 AM  [facility] [address] [place], [place], [address] Phone: [phone]   Fax:  [phone]  Name: Hailey Bryant MRN: [id number] Date of Birth: [birth year]

## 2021-01-06 ENCOUNTER — Ambulatory Visit: Payer: 59

## 2021-01-06 ENCOUNTER — Other Ambulatory Visit: Payer: Self-pay

## 2021-01-06 DIAGNOSIS — M6281 Muscle weakness (generalized): Secondary | ICD-10-CM

## 2021-01-06 DIAGNOSIS — M79604 Pain in right leg: Secondary | ICD-10-CM

## 2021-01-06 DIAGNOSIS — R2689 Other abnormalities of gait and mobility: Secondary | ICD-10-CM

## 2021-01-06 NOTE — Therapy (Signed)
[facility] [address] [place], [place], [address] Phone: [phone]   Fax:  [phone]  Physical Therapy Treatment/Discharge Summary  Patient Details  Name: Hailey Bryant MRN: [id number] Date of Birth: 1966-05-23 Referring Provider (PT): Dr. Jaynee Eagles  PHYSICAL THERAPY DISCHARGE SUMMARY  Visits from Start of Care: 7  Current functional level related to goals / functional outcomes: See clinical impression and goals for more information.   Remaining deficits: Intermittent pain in right leg with some weakness   Education / Equipment: HEP  Plan: Patient agrees to discharge.  Patient goals were partially met. Patient is being discharged due to meeting the stated rehab goals.  ?????       Encounter Date: 01/06/2021   PT End of Session - 01/06/21 0934    Visit Number 7    Number of Visits 9    Authorization Type [employer] - 60 VL, soft max.    PT Start Time 0930    PT Stop Time 1005   discharge visit   PT Time Calculation (min) 35 min    Activity Tolerance Patient tolerated treatment well    Behavior During Therapy WFL for tasks assessed/performed           Past Medical History:  Diagnosis Date  . Colon polyps     Past Surgical History:  Procedure Laterality Date  . ABDOMINAL HYSTERECTOMY    . KNEE SURGERY      There were no vitals filed for this visit.   Subjective Assessment - 01/06/21 0935    Subjective Pt reports that she has a lot going on but doing ok. She did go to the gym. Found that she could not do the LAQ machine using RLE even with no weight but not due to pain, just weak. Reports she feels much better since starting therapy and has ways to help now when having pain.    Pertinent History pre-diabetes    Limitations Sitting;Walking    Diagnostic tests xray of lumbar spine in 2019; IMPRESSION:  Moderate degenerative disc disease is noted at T12-L1. No acute  abnormality seen in the lumbar spine.     Patient Stated Goals to get back to walking, do a squat and be able to pick up something from the ground without pain    Currently in Pain? Yes    Pain Score 3     Pain Orientation Right    Pain Descriptors / Indicators Sore    Pain Type Chronic pain    Pain Onset More than a month ago    Pain Frequency Intermittent              [facility] PT Assessment - 01/06/21 0936      6 minute walk test results    Aerobic Endurance Distance Walked 8250    Endurance additional comments Pt denied any pain with walk.                         [facility] Adult PT Treatment/Exercise - 01/06/21 0936      Ambulation/Gait   Ambulation/Gait Yes    Ambulation/Gait Assistance 7: Independent    Assistive device None    Stairs Yes    Stairs Assistance 6: Modified independent (Device/Increase time)    Stairs Assistance Details (indicate cue type and reason) Pt reported just a little burning in right calf with descent.    Stair Management Technique Two rails;Alternating pattern    Number of  Stairs 8    Height of Stairs 6      Exercises   Exercises Other Exercises    Other Exercises  Pt performed squat to pick up cone off floor. Pt was cued to stay close to cone and engage core keeping back straight. Pt denied any pain but was challenged with stability at times due to some weakness on right favoring with left. PT had pt perform mini-squat in front of chair x 10 with cues to only go down as far as she could control.      Knee/Hip Exercises: Aerobic   Stepper SciFit with BUE and BLE level 4 x 6 min.                  PT Education - 01/06/21 1946    Education Details Finalized HEP. Discussed discharge plan at this time but if any changes can always return in the future.    Person(s) Educated Patient    Methods Explanation;Demonstration;Handout    Comprehension Verbalized understanding;Returned demonstration            PT Short Term Goals - 12/12/20 0955      PT SHORT TERM GOAL  #1   Title ALL STGS = LTGS             PT Long Term Goals - 01/06/21 0951      PT LONG TERM GOAL #1   Title Pt will be independent with HEP for strength/ROM/and flexibility. ALL LTGS DUE 01/09/21    Baseline Pt has been instructed in HEP to continue on own.    Time 4    Period Weeks    Status Achieved      PT LONG TERM GOAL #2   Title Pt will increase 6 min walk from 1190' to >1300' with minimal complaints of numbness in right calf and <4/10 pain in left groin for improved function.    Baseline 12/16/20 1190' with numbness in right calf after 3 min and 4/10 pain in left groin after 4 min. 01/06/21 1265' with no reports of pain    Time 4    Period Weeks    Status Partially Met      PT LONG TERM GOAL #3   Title Pt will be able to perform a functional squat to pick an object off of the ground with no reports of pain.    Baseline No pain with squat just noted weakness in right leg.    Time 4    Period Weeks    Status Achieved      PT LONG TERM GOAL #4   Title Pt will be able to perform the 4 steps going up/down with/without the handrail to her house with no reports of pain down RLE in order to improve functional mobility.    Baseline Pt able to perform steps with no significant pain but did report some burning in right calf 3/10.    Time 4    Period Weeks    Status Partially Met                 Plan - 01/06/21 1949    Clinical Impression Statement PT reassessed goals at visit. Pt has met or partially met all goals. She is independent with HEP. Increased 6 min walk distance with no pain just not to distance goal. She is independent with gait. Pt did not have any pain with functional squat but does have some weakness in RLE. Able to perform steps with  reciprocal pattern with rail with 3/10 burning in right calf with descent that resides quickly. Pt feels she has tools to continue to work on strength, flexibility and to manage pain if she gets some. She will discharge today and  continue to work on own at this time.    Personal Factors and Comorbidities Comorbidity 1    Comorbidities pre-diabetes    Examination-Activity Limitations Squat;Stairs;Sit    Examination-Participation Restrictions --   walking long distances   Stability/Clinical Decision Making Stable/Uncomplicated    Rehab Potential Excellent    PT Frequency 2x / week    PT Duration 4 weeks    PT Treatment/Interventions ADLs/Self Care Home Management;Aquatic Therapy;Gait training;Stair training;Functional mobility training;Therapeutic activities;Neuromuscular re-education;Balance training;Therapeutic exercise;Patient/family education;Manual techniques;Passive range of motion    PT Next Visit Plan Discharge PT today    Consulted and Agree with Plan of Care Patient           Patient will benefit from skilled therapeutic intervention in order to improve the following deficits and impairments:  Decreased activity tolerance,Decreased range of motion,Decreased strength,Impaired flexibility,Impaired sensation,Pain  Visit Diagnosis: Other abnormalities of gait and mobility  Pain in right leg  Muscle weakness (generalized)     Problem List Patient Active Problem List   Diagnosis Date Noted  . Numbness and tingling of right leg 09/09/2020  . Pre-diabetes 05/14/2015  . History of cervical cancer 02/26/2015  . Abnormal Pap smear of cervix 07/24/2014    Electa Sniff, PT, DPT,NCS 01/06/2021, 7:53 PM  [facility] [address] [place], [place], [address] Phone: [phone]   Fax:  [phone]  Name: Hailey Bryant MRN: [id number] Date of Birth: 04/25/1966

## 2021-01-06 NOTE — Patient Instructions (Signed)
Access Code: [email or web address] URL: [email or web address] Date: 01/06/2021 Prepared by: Cherly Anderson  Exercises Prone Press Up On Elbows - 3 x daily - 7 x weekly - 1 sets - 10 reps Supine March - 2 x daily - 7 x weekly - 1 sets - 10 reps Bent knee fallout - 2 x daily - 7 x weekly - 1 sets - 10 reps Supine Bridge - 2 x daily - 7 x weekly - 1 sets - 10 reps Mini Squat with Counter Support - 1 x daily - 7 x weekly - 1 sets - 10 reps Seated Long Arc Quad with Ankle Weight - 1 x daily - 7 x weekly - 2 sets - 10 reps Standing Lumbar Extension - 3 x daily - 7 x weekly - 1 sets - 10 reps

## 2021-01-08 ENCOUNTER — Ambulatory Visit: Payer: 59

## 2021-01-14 ENCOUNTER — Ambulatory Visit: Payer: 59 | Admitting: Physical Therapy

## 2021-02-05 ENCOUNTER — Other Ambulatory Visit (HOSPITAL_BASED_OUTPATIENT_CLINIC_OR_DEPARTMENT_OTHER): Payer: Self-pay | Admitting: Family Medicine

## 2021-02-05 DIAGNOSIS — Z1231 Encounter for screening mammogram for malignant neoplasm of breast: Secondary | ICD-10-CM

## 2021-02-13 LAB — HM COLONOSCOPY

## 2021-02-20 ENCOUNTER — Encounter: Payer: Self-pay | Admitting: Family Medicine

## 2021-03-17 ENCOUNTER — Encounter (HOSPITAL_BASED_OUTPATIENT_CLINIC_OR_DEPARTMENT_OTHER): Payer: 59

## 2021-03-18 ENCOUNTER — Encounter: Payer: 59 | Admitting: Family Medicine

## 2021-03-19 ENCOUNTER — Encounter (INDEPENDENT_AMBULATORY_CARE_PROVIDER_SITE_OTHER): Payer: Self-pay

## 2021-03-26 NOTE — Patient Instructions (Addendum)
It was great to see you again today, I will be in touch with your labs as soon as possible Shingles vaccine series at your convenience and flu shot this fall  You can do your hip x-ray at your convenience at [facility] on [address]

## 2021-03-26 NOTE — Progress Notes (Addendum)
[facility] [address] [place], [place] [address] [phone]  Date:  03/30/2021   Name:  Hailey Bryant   DOB:  01/11/1966   MRN:  [id number]  PCP:  Darreld Mclean, MD    Chief Complaint: Annual Exam   History of Present Illness:  Hailey Bryant is a 55 y.o. very pleasant female patient who presents with the following:  Patient seen today for physical exam Most recent visit with myself about 1 year ago  History of prediabetes, cervical cancer status post hysterectomy She had a hysterectomy with 1 ovary remaining.  20 years have passed since her hysterectomy, she is elected to stop Pap screening of the vaginal cuff at this time Record year hysterectomy 1998  She has 3 children, 6 school-aged grandchildren Everyone in the family is doing well She works as a Quarry manager for [employer], and does her CNA work on the weekends She is not able to get as much exercise due to pain and numbness in her right leg; she has been doing some therapy, it is thought that the symptoms are actually due to her left hip Never smoker, does not drink alcohol  Shingles vaccine-I had thought her first dose was given last year, but this is not on her record She plans to do this at [facility] as we are out today She plans to do her flu shot later on this year  COVID-19 booster-she had 2 doses, is not planning to do any further Mammogram done today!  Colon done in July -given 10-year follow-up   Patient Active Problem List   Diagnosis Date Noted   Numbness and tingling of right leg 09/09/2020   Pre-diabetes 05/14/2015   History of cervical cancer 02/26/2015   Abnormal Pap smear of cervix 07/24/2014    Past Medical History:  Diagnosis Date   Colon polyps     Past Surgical History:  Procedure Laterality Date   ABDOMINAL HYSTERECTOMY     KNEE SURGERY      Social History   Tobacco Use   Smoking status: Never   Smokeless tobacco:  Never  Vaping Use   Vaping Use: Never used  Substance Use Topics   Alcohol use: Never    Alcohol/week: 0.0 standard drinks   Drug use: Never    Family History  Problem Relation Age of Onset   Hypertension Mother    Cancer Sister    Breast cancer Sister    Neuropathy Sister        "that's because she drinks"    No Known Allergies  Medication list has been reviewed and updated.  No current outpatient medications on file prior to visit.   No current facility-administered medications on file prior to visit.    Review of Systems:  As per HPI- otherwise negative.   Physical Examination: Vitals:   03/30/21 0935  BP: 132/80  Pulse: 63  Resp: 16  Temp: 97.9 F (36.6 C)  SpO2: 99%   Vitals:   03/30/21 0935  Weight: 209 lb (94.8 kg)  Height: '5\' 8"'$  (1.727 m)   Body mass index is 31.78 kg/m. Ideal Body Weight: Weight in (lb) to have BMI = 25: 164.1  GEN: no acute distress.  Obese, otherwise looks well HEENT: Atraumatic, Normocephalic. Bilateral TM wnl,PEERL,EOMI.   Ears and Nose: No external deformity. CV: RRR, No M/G/R. No JVD. No thrill. No extra heart sounds. PULM: CTA B, no wheezes, crackles,  rhonchi. No retractions. No resp. distress. No accessory muscle use. ABD: S, NT, ND, +BS. No rebound. No HSM. EXTR: No c/c/e PSYCH: Normally interactive. Conversant.    Assessment and Plan: Physical exam  Pre-diabetes - Plan: Comprehensive metabolic panel, Hemoglobin A1c  Screening for deficiency anemia - Plan: CBC  Screening for thyroid disorder - Plan: TSH  Screening for hyperlipidemia - Plan: Lipid panel  Fatigue, unspecified type - Plan: TSH  Left hip pain - Plan: DG Hip Unilat W OR W/O Pelvis 2-3 Views Left   Physical exam today.  Encouraged healthy diet and exercise routine Will plan further follow- up pending labs. She has plans to do a flu shot later on this year Discussed health maintenance.  Recommended calcium and vitamin D supplementation I  ordered an x-ray of her left hip to be done at a later date  This visit occurred during the SARS-CoV-2 public health emergency.  Safety protocols were in place, including screening questions prior to the visit, additional usage of staff PPE, and extensive cleaning of exam room while observing appropriate contact time as indicated for disinfecting solutions.   Signed Lamar Blinks, MD  Received her labs as below, message to patient  Results for orders placed or performed in visit on 03/30/21  CBC  Result Value Ref Range   WBC 4.4 4.0 - 10.5 K/uL   RBC 4.36 3.87 - 5.11 Mil/uL   Platelets 284.0 150.0 - 400.0 K/uL   Hemoglobin 12.3 12.0 - 15.0 g/dL   HCT 37.1 36.0 - 46.0 %   MCV 85.1 78.0 - 100.0 fl   MCHC 33.2 30.0 - 36.0 g/dL   RDW 13.8 11.5 - 15.5 %  Comprehensive metabolic panel  Result Value Ref Range   Sodium 139 135 - 145 mEq/L   Potassium 4.8 3.5 - 5.1 mEq/L   Chloride 102 96 - 112 mEq/L   CO2 29 19 - 32 mEq/L   Glucose, Bld 88 70 - 99 mg/dL   BUN 15 6 - 23 mg/dL   Creatinine, Ser 0.74 0.40 - 1.20 mg/dL   Total Bilirubin 0.5 0.2 - 1.2 mg/dL   Alkaline Phosphatase 72 39 - 117 U/L   AST 15 0 - 37 U/L   ALT 10 0 - 35 U/L   Total Protein 7.4 6.0 - 8.3 g/dL   Albumin 4.1 3.5 - 5.2 g/dL   GFR 90.54 >60.00 mL/min   Calcium 9.7 8.4 - 10.5 mg/dL  Hemoglobin A1c  Result Value Ref Range   Hgb A1c MFr Bld 5.9 4.6 - 6.5 %  Lipid panel  Result Value Ref Range   Cholesterol 240 (H) 0 - 200 mg/dL   Triglycerides 62.0 0.0 - 149.0 mg/dL   HDL 89.20 >39.00 mg/dL   VLDL 12.4 0.0 - 40.0 mg/dL   LDL Cholesterol 138 (H) 0 - 99 mg/dL   Total CHOL/HDL Ratio 3    NonHDL 150.64   TSH  Result Value Ref Range   TSH 1.13 0.35 - 5.50 uIU/mL   The 10-year ASCVD risk score Mikey Bussing DC Jr., et al., 2013) is: 3%   Values used to calculate the score:     Age: 83 years     Sex: Female     Is Non-Hispanic African American: Yes     Diabetic: No     Tobacco smoker: No     Systolic Blood  Pressure: 132 mmHg     Is BP treated: No     HDL Cholesterol: 89.2 mg/dL  Total Cholesterol: 240 mg/dL

## 2021-03-30 ENCOUNTER — Ambulatory Visit (HOSPITAL_BASED_OUTPATIENT_CLINIC_OR_DEPARTMENT_OTHER)
Admission: RE | Admit: 2021-03-30 | Discharge: 2021-03-30 | Disposition: A | Discharge status: Home / Self Care | Payer: 59 | Source: Ambulatory Visit | Attending: Family Medicine | Admitting: Family Medicine

## 2021-03-30 ENCOUNTER — Encounter: Payer: Self-pay | Admitting: Family Medicine

## 2021-03-30 ENCOUNTER — Ambulatory Visit (INDEPENDENT_AMBULATORY_CARE_PROVIDER_SITE_OTHER): Payer: 59 | Admitting: Family Medicine

## 2021-03-30 ENCOUNTER — Other Ambulatory Visit: Payer: Self-pay

## 2021-03-30 ENCOUNTER — Other Ambulatory Visit (HOSPITAL_BASED_OUTPATIENT_CLINIC_OR_DEPARTMENT_OTHER): Payer: Self-pay

## 2021-03-30 ENCOUNTER — Encounter (HOSPITAL_BASED_OUTPATIENT_CLINIC_OR_DEPARTMENT_OTHER): Payer: Self-pay

## 2021-03-30 VITALS — BP 132/80 | HR 63 | Temp 97.9°F | Resp 16 | Ht 68.0 in | Wt 209.0 lb

## 2021-03-30 DIAGNOSIS — Z1329 Encounter for screening for other suspected endocrine disorder: Secondary | ICD-10-CM | POA: Diagnosis not present

## 2021-03-30 DIAGNOSIS — M25552 Pain in left hip: Secondary | ICD-10-CM

## 2021-03-30 DIAGNOSIS — Z1231 Encounter for screening mammogram for malignant neoplasm of breast: Secondary | ICD-10-CM | POA: Diagnosis present

## 2021-03-30 DIAGNOSIS — R5383 Other fatigue: Secondary | ICD-10-CM | POA: Diagnosis not present

## 2021-03-30 DIAGNOSIS — Z130 Encounter for screening for diseases of the blood and blood-forming organs and certain disorders involving the immune mechanism: Secondary | ICD-10-CM

## 2021-03-30 DIAGNOSIS — R7303 Prediabetes: Secondary | ICD-10-CM

## 2021-03-30 DIAGNOSIS — Z13220 Encounter for screening for lipoid disorders: Secondary | ICD-10-CM

## 2021-03-30 DIAGNOSIS — Z0000 Encounter for general adult medical examination without abnormal findings: Secondary | ICD-10-CM | POA: Diagnosis not present

## 2021-03-30 LAB — LIPID PANEL
Cholesterol: 240 mg/dL — ABNORMAL HIGH (ref 0–200)
HDL: 89.2 mg/dL (ref >39.00)
LDL Cholesterol: 138 mg/dL — ABNORMAL HIGH (ref 0–99)
NonHDL: 150.64
Total CHOL/HDL Ratio: 3
Triglycerides: 62 mg/dL (ref 0.0–149.0)
VLDL: 12.4 mg/dL (ref 0.0–40.0)

## 2021-03-30 LAB — COMPREHENSIVE METABOLIC PANEL
ALT: 10 U/L (ref 0–35)
AST: 15 U/L (ref 0–37)
Albumin: 4.1 g/dL (ref 3.5–5.2)
Alkaline Phosphatase: 72 U/L (ref 39–117)
BUN: 15 mg/dL (ref 6–23)
CO2: 29 mEq/L (ref 19–32)
Calcium: 9.7 mg/dL (ref 8.4–10.5)
Chloride: 102 mEq/L (ref 96–112)
Creatinine, Ser: 0.74 mg/dL (ref 0.40–1.20)
GFR: 90.54 mL/min (ref >60.00)
Glucose, Bld: 88 mg/dL (ref 70–99)
Potassium: 4.8 mEq/L (ref 3.5–5.1)
Sodium: 139 mEq/L (ref 135–145)
Total Bilirubin: 0.5 mg/dL (ref 0.2–1.2)
Total Protein: 7.4 g/dL (ref 6.0–8.3)

## 2021-03-30 LAB — CBC
HCT: 37.1 % (ref 36.0–46.0)
Hemoglobin: 12.3 g/dL (ref 12.0–15.0)
MCHC: 33.2 g/dL (ref 30.0–36.0)
MCV: 85.1 fl (ref 78.0–100.0)
Platelets: 284 10*3/uL (ref 150.0–400.0)
RBC: 4.36 Mil/uL (ref 3.87–5.11)
RDW: 13.8 % (ref 11.5–15.5)
WBC: 4.4 10*3/uL (ref 4.0–10.5)

## 2021-03-30 LAB — HEMOGLOBIN A1C: Hgb A1c MFr Bld: 5.9 % (ref 4.6–6.5)

## 2021-03-30 LAB — TSH: TSH: 1.13 u[IU]/mL (ref 0.35–5.50)

## 2021-03-30 MED ORDER — ZOSTER VAC RECOMB ADJUVANTED 50 MCG/0.5ML IM SUSR
INTRAMUSCULAR | 0 refills | Status: DC
  Filled 2021-03-30: qty 1, 1d supply, fill #0

## 2021-04-01 ENCOUNTER — Ambulatory Visit
Admission: RE | Admit: 2021-04-01 | Discharge: 2021-04-01 | Disposition: A | Discharge status: Home / Self Care | Payer: 59 | Source: Ambulatory Visit | Attending: Family Medicine | Admitting: Family Medicine

## 2021-04-01 ENCOUNTER — Other Ambulatory Visit: Payer: Self-pay

## 2021-04-01 DIAGNOSIS — M25552 Pain in left hip: Secondary | ICD-10-CM

## 2021-04-04 ENCOUNTER — Encounter: Payer: Self-pay | Admitting: Family Medicine

## 2021-04-04 DIAGNOSIS — M25552 Pain in left hip: Secondary | ICD-10-CM

## 2021-04-15 ENCOUNTER — Encounter (HOSPITAL_COMMUNITY): Payer: Self-pay | Admitting: *Deleted

## 2021-04-15 ENCOUNTER — Other Ambulatory Visit: Payer: Self-pay

## 2021-04-15 ENCOUNTER — Ambulatory Visit (HOSPITAL_COMMUNITY)
Admission: EM | Admit: 2021-04-15 | Discharge: 2021-04-15 | Disposition: A | Discharge status: Home / Self Care | Payer: 59 | Attending: Internal Medicine | Admitting: Internal Medicine

## 2021-04-15 DIAGNOSIS — R059 Cough, unspecified: Secondary | ICD-10-CM | POA: Insufficient documentation

## 2021-04-15 DIAGNOSIS — M791 Myalgia, unspecified site: Secondary | ICD-10-CM | POA: Insufficient documentation

## 2021-04-15 DIAGNOSIS — B349 Viral infection, unspecified: Secondary | ICD-10-CM

## 2021-04-15 DIAGNOSIS — J029 Acute pharyngitis, unspecified: Secondary | ICD-10-CM | POA: Insufficient documentation

## 2021-04-15 DIAGNOSIS — R6883 Chills (without fever): Secondary | ICD-10-CM | POA: Insufficient documentation

## 2021-04-15 DIAGNOSIS — Z20822 Contact with and (suspected) exposure to covid-19: Secondary | ICD-10-CM

## 2021-04-15 DIAGNOSIS — R519 Headache, unspecified: Secondary | ICD-10-CM | POA: Insufficient documentation

## 2021-04-15 DIAGNOSIS — U071 COVID-19: Secondary | ICD-10-CM | POA: Diagnosis not present

## 2021-04-15 LAB — SARS CORONAVIRUS 2 (TAT 6-24 HRS): SARS Coronavirus 2: POSITIVE — AB

## 2021-04-15 LAB — POCT URINALYSIS DIPSTICK, ED / UC
Bilirubin Urine: NEGATIVE
Glucose, UA: NEGATIVE mg/dL
Ketones, ur: NEGATIVE mg/dL
Leukocytes,Ua: NEGATIVE
Nitrite: NEGATIVE
Protein, ur: NEGATIVE mg/dL
Specific Gravity, Urine: 1.02 (ref 1.005–1.030)
Urobilinogen, UA: 0.2 mg/dL (ref 0.0–1.0)
pH: 7 (ref 5.0–8.0)

## 2021-04-15 MED ORDER — KETOROLAC TROMETHAMINE 30 MG/ML IJ SOLN
30.0000 mg | Freq: Once | INTRAMUSCULAR | Status: AC
  Administered 2021-04-15: 30 mg via INTRAMUSCULAR

## 2021-04-15 MED ORDER — KETOROLAC TROMETHAMINE 30 MG/ML IJ SOLN
INTRAMUSCULAR | Status: AC
  Filled 2021-04-15: qty 1

## 2021-04-15 NOTE — ED Triage Notes (Signed)
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 Pt had a [facility] home COVID test.

## 2021-04-15 NOTE — ED Triage Notes (Signed)
Pt reports started having a HA,Nausea,Sore throat on yesterday.

## 2021-04-15 NOTE — ED Provider Notes (Signed)
[facility]    CSN: [id number] Arrival date & time: 04/15/21  0801      History   Chief Complaint Chief Complaint  Patient presents with   Headache   Nausea   Urinary Frequency   Sore Throat    HPI GENNESIS CIOTTI is a 55 y.o. female.   Patient states she works in a [employer] facility.  She states that this past weekend, 7 of the residents tested positive for COVID-19.  Patient states that Monday she went to [facility] for COVID-19 testing and it was negative.  Patient states she woke up today with a intense headache, body aches, chills, cough, sore throat and pain in her chest.  Patient states she has not taken any medication for the headache but describes it as constant and unbearable.  Per my observation, patient is unable to find a comfortable position where she can tolerate her pain.  Patient has an elevated body temperature at this time and is experiencing chills.  Patient states she is fully vaccinated and has had 2 boosters.  Patient also complains of increased urinary frequency overnight, states she woke up at midnight, 1, 2, 3 and 4 AM to urinate, denies any discomfort with urination and endorses sensation of emptying her bladder with each void, patient states this is not normal for her at all.  Patient denies sputum production with cough,difficulty swallowing, shortness of breath.  Patient states the only other person in her household is her husband who is feeling well at this time, he is also vaccinated for COVID-19.Marland Kitchen  The history is provided by the patient.  Headache Urinary Frequency  Sore Throat   Past Medical History:  Diagnosis Date   Colon polyps     Patient Active Problem List   Diagnosis Date Noted   Numbness and tingling of right leg 09/09/2020   Pre-diabetes 05/14/2015   History of cervical cancer 02/26/2015   Abnormal Pap smear of cervix 07/24/2014    Past Surgical History:  Procedure Laterality Date   ABDOMINAL HYSTERECTOMY  1998   KNEE  SURGERY      OB History   No obstetric history on file.      Home Medications    Prior to Admission medications   Medication Sig Start Date End Date Taking? Authorizing Provider  Zoster Vaccine Adjuvanted [facility]) injection Inject into the muscle. 03/30/21   Carlyle Basques, MD    Family History Family History  Problem Relation Age of Onset   Hypertension Mother    Cancer Sister    Breast cancer Sister    Neuropathy Sister        "that's because she drinks"    Social History Social History   Tobacco Use   Smoking status: Never   Smokeless tobacco: Never  Vaping Use   Vaping Use: Never used  Substance Use Topics   Alcohol use: Never    Alcohol/week: 0.0 standard drinks   Drug use: Never     Allergies   Patient has no known allergies.   Review of Systems Review of Systems Per HPI  Physical Exam Triage Vital Signs ED Triage Vitals  Enc Vitals Group     BP 04/15/21 0819 117/74     Pulse Rate 04/15/21 0819 66     Resp 04/15/21 0819 20     Temp 04/15/21 0819 (!) 101 F (38.3 C)     Temp src --      SpO2 04/15/21 0819 97 %  Weight --      Height --      Head Circumference --      Peak Flow --      Pain Score 04/15/21 0822 7     Pain Loc --      Pain Edu? --      Excl. in [place]? --    No data found.  Updated Vital Signs BP 117/74   Pulse 66   Temp (!) 101 F (38.3 C)   Resp 20   SpO2 97%   Visual Acuity Right Eye Distance:   Left Eye Distance:   Bilateral Distance:    Right Eye Near:   Left Eye Near:    Bilateral Near:     Physical Exam Constitutional:      Appearance: She is well-developed.  HENT:     Head: Normocephalic and atraumatic.     Right Ear: Hearing, tympanic membrane, ear canal and external ear normal.     Left Ear: Hearing, tympanic membrane, ear canal and external ear normal.     Nose: Nose normal.     Mouth/Throat:     Lips: Pink.     Mouth: Mucous membranes are moist.     Tongue: No lesions. Tongue does not  deviate from midline.     Palate: No mass and lesions.     Pharynx: Oropharynx is clear. Uvula midline. No pharyngeal swelling, oropharyngeal exudate or posterior oropharyngeal erythema.     Tonsils: No tonsillar exudate or tonsillar abscesses.  Eyes:     Extraocular Movements: Extraocular movements intact.  Cardiovascular:     Rate and Rhythm: Normal rate and regular rhythm.  Pulmonary:     Effort: Pulmonary effort is normal. No respiratory distress.     Breath sounds: Normal breath sounds. No stridor. No wheezing, rhonchi or rales.  Chest:     Chest wall: No tenderness.  Abdominal:     General: Bowel sounds are normal.     Palpations: Abdomen is soft.  Musculoskeletal:        General: Normal range of motion.     Cervical back: Normal range of motion and neck supple.  Skin:    General: Skin is warm and dry.  Neurological:     Mental Status: She is alert and oriented to person, place, and time.  Psychiatric:        Mood and Affect: Mood normal.        Speech: Speech normal.        Behavior: Behavior normal.     UC Treatments / Results  Labs (all labs ordered are listed, but only abnormal results are displayed) Labs Reviewed  POCT URINALYSIS DIPSTICK, ED / UC - Abnormal; Notable for the following components:      Result Value   Hgb urine dipstick TRACE (*)    All other components within normal limits  SARS CORONAVIRUS 2 (TAT 6-24 HRS)  URINE CULTURE  POCT URINALYSIS DIPSTICK, ED / UC    EKG   Radiology No results found.  Procedures Procedures (including critical care time)  Medications Ordered in UC Medications  ketorolac (TORADOL) 30 MG/ML injection 30 mg (has no administration in time range)    Initial Impression / Assessment and Plan / UC Course  I have reviewed the triage vital signs and the nursing notes.  Pertinent labs & imaging results that were available during my care of the patient were reviewed by me and considered in my medical decision making  (see chart  for details).     Based on patient history and presentation as well as my physical exam today, patient has certainly been exposed to COVID-19 and most likely is infected.  COVID-19 testing will be performed today and patient will be notified of results once they are received.  Urine dip today showed trace hemoglobin, given absence of dysuria, will send urine out for culture and treat based upon results.  Patient was evaluated, tested and sent home with instructions for home care and Quarantine including pushing clear fluids, p.o. intake as tolerated, lying prone to sleep to keep lungs open and remaining as active as possible during the day.  She has been instructed to seek further care if she develops altered mental status, shortness of breath.  Patient verbalized understanding and agreed with plan as outlined.  Final Clinical Impressions(s) / UC Diagnoses   Final diagnoses:  Viral illness  Bad headache  Sore throat  Cough  Myalgia  Chills  Exposure to confirmed case of COVID-19     Discharge Instructions      Based on your presentation today, your history of known exposure and a physical exam, is most likely that you had acquired COVID-19 infection.  Please quarantine at home until your symptoms have significantly improved without medication.  You received an injection of ketorolac which will hopefully address your headache today.  You can begin taking 800 mg of ibuprofen midday and then again this evening, and then 3 times daily starting tomorrow.  Please keep in mind it is much easier to treat a small amount of pain than is to treat a lot of pain.  Please make sure you are getting plenty of clear fluids and eating as tolerated.  Be sure to sleep on your stomach to help keep your lungs open.  Please try to be up and about your house is much as you can as this will keep your lung function robust and help prevent increasing joint muscle pain.  Please seek emergency medical  attention should he become acutely short of breath or have an altered mental status by calling 911.     ED Prescriptions   None    PDMP not reviewed this encounter.   Lynden Oxford Scales, [place] 04/15/21 [phone]

## 2021-04-15 NOTE — Discharge Instructions (Addendum)
Based on your presentation today, your history of known exposure and a physical exam, is most likely that you had acquired COVID-19 infection.  Please quarantine at home until your symptoms have significantly improved without medication.  You received an injection of ketorolac which will hopefully address your headache today.  You can begin taking 800 mg of ibuprofen midday and then again this evening, and then 3 times daily starting tomorrow.  Please keep in mind it is much easier to treat a small amount of pain than is to treat a lot of pain.  Please make sure you are getting plenty of clear fluids and eating as tolerated.  Be sure to sleep on your stomach to help keep your lungs open.  Please try to be up and about your house is much as you can as this will keep your lung function robust and help prevent increasing joint muscle pain.  Please seek emergency medical attention should he become acutely short of breath or have an altered mental status by calling 911.

## 2021-04-16 LAB — URINE CULTURE: Culture: <10000 — AB

## 2021-04-21 ENCOUNTER — Ambulatory Visit: Payer: 59 | Admitting: Orthopaedic Surgery

## 2021-12-07 NOTE — Progress Notes (Signed)
Therapist, music at [facility] ?[address], [address] ?[place], [place] [address] ?336 [phone] ?Fax [phone] ? ?Date:  12/09/2021  ? ?Name:  Hailey Bryant   DOB:  [birth year]   MRN:  [id number] ? ?PCP:  Darreld Mclean, MD  ? ? ?Chief Complaint: No chief complaint on file. ? ? ?History of Present Illness: ? ?Hailey Bryant is a 56 y.o. very pleasant female patient who presents with the following: ?Virtual visit today.  Connected with patient via MyChart video.  Patient identity confirmed with 2 factors, she gives consent for virtual visit today.  The patient myself are present on the call ?Pt location is home, my location is office ? ?Patient is seen today with concern of constipation ?History of prediabetes, cervical cancer status post hysterectomy in 1998 ?Most recent visit with myself was for physical exam in summer of last year ? ?She has 3 children, 6 grandchildren ? ?Mammogram is up-to-date ?Most recent labs in August ?She had a colonoscopy last summer, 1 polyp removed ? ?She notes about 3 weeks of difficulty with "consistent BM"  ?Otherwise typically she would have a bowel movement daily after her coffee ?Then she noted gaps of 2-3 days, even 4 days between BM-no cause that she can determine ?She tried dulcolax and this worked for her; however she has continued to notice several day gaps between bowel movements if she does not take Dulcolax ?No pain or discomfort, she has noted gas but otherwise feels normal ?No vomiting ?No major diet change- she is perhaps eating a bit less volume recently ?She did see some blood on the TP from straining to pass hard stool but otherwise no blood ?No dark or tarry stools  ?No weight loss  ?No change in medication, no change in her routine except perhaps sitting more  ? ?Patient Active Problem List  ? Diagnosis Date Noted  ? Numbness and tingling of right leg 09/09/2020  ? Pre-diabetes 05/14/2015  ? History of cervical cancer 02/26/2015  ? Abnormal  Pap smear of cervix 07/24/2014  ? ? ?Past Medical History:  ?Diagnosis Date  ? Colon polyps   ? ? ?Past Surgical History:  ?Procedure Laterality Date  ? ABDOMINAL HYSTERECTOMY  1998  ? KNEE SURGERY    ? ? ?Social History  ? ?Tobacco Use  ? Smoking status: Never  ? Smokeless tobacco: Never  ?Vaping Use  ? Vaping Use: Never used  ?Substance Use Topics  ? Alcohol use: Never  ?  Alcohol/week: 0.0 standard drinks  ? Drug use: Never  ? ? ?Family History  ?Problem Relation Age of Onset  ? Hypertension Mother   ? Cancer Sister   ? Breast cancer Sister   ? Neuropathy Sister   ?     "that's because she drinks"  ? ? ?No Known Allergies ? ?Medication list has been reviewed and updated. ? ?Current Outpatient Medications on File Prior to Visit  ?Medication Sig Dispense Refill  ? Zoster Vaccine Adjuvanted [facility]) injection Inject into the muscle. 1 each 0  ? ?No current facility-administered medications on file prior to visit.  ? ? ?Review of Systems: ? ?As per HPI- otherwise negative. ? ? ?Physical Examination: ?There were no vitals filed for this visit. ?There were no vitals filed for this visit. ?There is no height or weight on file to calculate BMI. ?Ideal Body Weight:   ?Patient observed her video monitor.  She looks well, no distress ? ? ?Assessment and  Plan: ?Bowel habit changes ? ?Virtual visit today to discuss changes in bowel habits.  She has noted more difficulty passing stools and less frequent stools for about 1 month.  She did have a colonoscopy less than a year ago which is reassuring. ?Suggested that she take daily MiraLAX for 1 week, then a half dose daily for 1 week, then try half dose every other day for 1 week and then stop.  If this fails to get her back to normal however she will let me know, in this case we will look further ? ?Signed ?Lamar Blinks, MD ? ?

## 2021-12-09 ENCOUNTER — Telehealth (INDEPENDENT_AMBULATORY_CARE_PROVIDER_SITE_OTHER): Payer: 59 | Admitting: Family Medicine

## 2021-12-09 DIAGNOSIS — R194 Change in bowel habit: Secondary | ICD-10-CM | POA: Diagnosis not present

## 2021-12-21 ENCOUNTER — Encounter: Payer: Self-pay | Admitting: Family Medicine

## 2022-02-11 ENCOUNTER — Encounter: Payer: Self-pay | Admitting: Family Medicine

## 2022-02-15 ENCOUNTER — Telehealth (INDEPENDENT_AMBULATORY_CARE_PROVIDER_SITE_OTHER): Payer: 59 | Admitting: Family Medicine

## 2022-02-15 DIAGNOSIS — F4323 Adjustment disorder with mixed anxiety and depressed mood: Secondary | ICD-10-CM | POA: Diagnosis not present

## 2022-02-15 MED ORDER — FLUOXETINE HCL 20 MG PO TABS: 20.0000 mg | ORAL_TABLET | Freq: Every day | ORAL | 3 refills | Status: DC

## 2022-02-15 NOTE — Progress Notes (Signed)
[name] at [facility] [address], [place], [place] [address] [phone]  Date:  02/15/2022   Name:  Hailey Bryant   DOB:  16-Jun-1966   MRN:  [id number]  PCP:  Darreld Mclean, MD    Chief Complaint: No chief complaint on file.   History of Present Illness:  Hailey Bryant is a 56 y.o. very pleasant female patient who presents with the following:  Most recent visit with myself was in May of this year Virtual visit today- pt location is home and my location is office Pt ID confirmed with 2 factors [name] gives consent for virtual visit today.  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 but it is really not a good fit - it is too stressful for her and is causing her a lot of difficulty She started in October and has been trying to "hang in there" -she was told she could potentially switch back to her old position after 1 year She notes she may feel panicked and have some physical symptoms of dread prior to going to work, nausea, tearfulness, anxiety and depression She denies any suicidal intention but feels like she needs to make a change  She notes her BP has been up to about 150/80-90 Her BP is typically normal- she has noted it running high for about 2.5 weeks She has not been previously treated for anxiety, but notes she had similar symptoms when she was in school She is not sleeping well either - she will be dreading the next day She would like to start her FMLA today 7/17- would like to have 6 weeks out of work  She did go to work last week  She is working on getting a Transport planner and plans to get established ASAP   Pulse Readings from Last 3 Encounters:  04/15/21 66  03/30/21 63  11/04/20 68   BP Readings from Last 3 Encounters:  04/15/21 117/74  03/30/21 132/80  11/04/20 139/83     Patient Active Problem List   Diagnosis Date Noted    Numbness and tingling of right leg 09/09/2020   Pre-diabetes 05/14/2015   History of cervical cancer 02/26/2015   Abnormal Pap smear of cervix 07/24/2014    Past Medical History:  Diagnosis Date   Colon polyps     Past Surgical History:  Procedure Laterality Date   ABDOMINAL HYSTERECTOMY  1998   KNEE SURGERY      Social History   Tobacco Use   Smoking status: Never   Smokeless tobacco: Never  Vaping Use   Vaping Use: Never used  Substance Use Topics   Alcohol use: Never    Alcohol/week: 0.0 standard drinks of alcohol   Drug use: Never    Family History  Problem Relation Age of Onset   Hypertension Mother    Cancer Sister    Breast cancer Sister    Neuropathy Sister        "that's because she drinks"    No Known Allergies  Medication list has been reviewed and updated.  Current Outpatient Medications on File Prior to Visit  Medication Sig Dispense Refill   Zoster Vaccine Adjuvanted [facility]) injection Inject into the muscle. 1 each 0   No current facility-administered medications on file prior to visit.    Review of Systems:  As per HPI- otherwise  negative.   Physical Examination: There were no vitals filed for this visit. There were no vitals filed for this visit. There is no height or weight on file to calculate BMI. Ideal Body Weight:    Patient observed over video monitor.  She looks physically well but is sometimes tearful  Assessment and Plan: Adjustment disorder with mixed anxiety and depressed mood - Plan: FLUoxetine (PROZAC) 20 MG tablet  Patient seen today with anxiety and depression, likely related to an overly demanding work situation.  We will arrange for her to have a period of time out on FMLA.  I encouraged her to stick to a schedule, get regular exercise, eat and sleep on a regular routine while she is out of work.  We will have her start on fluoxetine 20, can increase to 40 mg in 1 to 2 weeks She plans to establish with a  therapist Scheduled a follow-up visit in 2 weeks to check on her progress-she will contact me sooner if not doing okay  Signed Lamar Blinks, MD

## 2022-02-17 ENCOUNTER — Telehealth: Payer: Self-pay | Admitting: Family Medicine

## 2022-02-17 NOTE — Telephone Encounter (Signed)
In folder for signature.  °

## 2022-02-17 NOTE — Telephone Encounter (Signed)
Pt dropped off document to be filled out by provider (FMLA paperwork - 4 pages) Pt would like document to be faxed when document ready at fax number [phone]. Document put at front office tray under providers name.

## 2022-02-19 NOTE — Telephone Encounter (Signed)
6 new pages from [place] were faxed into front office  Placed in [place] folder for anything new needed

## 2022-02-22 NOTE — Telephone Encounter (Signed)
In folder for review.

## 2022-02-26 NOTE — Progress Notes (Unsigned)
[place] at [facility] [address], [place], [place] [address] [phone]  Date:  03/01/2022   Name:  Hailey Bryant   DOB:  1965-11-08   MRN:  [id number]  PCP:  Darreld Mclean, MD    Chief Complaint: No chief complaint on file.   History of Present Illness:  Hailey Bryant is a 56 y.o. very pleasant female patient who presents with the following:  Pt seen today to follow-up.  Virtual visit today, patient location is home and my location is office.  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 She notes her BP has been up to about 150/80-90 Her BP is typically normal- she has noted it running high for about 2.5 weeks She has not been previously treated for anxiety, but notes she had similar symptoms when she was in school She is not sleeping well either - she will be dreading the next day She would like to start her FMLA today 7/17- would like to have 6 weeks out of work She did go to work last week  She is working on getting a Transport planner and plans to get established ASAP  We started on fluoxetine at last visit  She is taking some time away from work right now which is helping -we are able to arrange for 6-week leave of absence She is also already started seeing a  therapist which is helping  Her BP has been normal- she had to go to [facility] last week with a sinus infection and BP was ok at that office She did try fluoxetine for a few days but she did not like it- she has not continued to take it.   She is working on being outdoors and managing her stress in other ways, she is feeling much better  She is able to sleep well with unisom and is eating well  Overall, Hailey Bryant feels that she is making good progress.  No suicidal ideation   Patient Active Problem List   Diagnosis Date Noted   Numbness and tingling of right leg 09/09/2020   Pre-diabetes 05/14/2015   History of cervical cancer 02/26/2015   Abnormal Pap smear of cervix 07/24/2014    Past Medical History:  Diagnosis Date   Colon polyps     Past Surgical History:  Procedure Laterality Date   ABDOMINAL HYSTERECTOMY  1998   KNEE SURGERY      Social History   Tobacco Use   Smoking status: Never   Smokeless tobacco: Never  Vaping Use   Vaping Use: Never used  Substance Use Topics   Alcohol use: Never    Alcohol/week: 0.0 standard drinks of alcohol   Drug use: Never    Family  History  Problem Relation Age of Onset   Hypertension Mother    Cancer Sister    Breast cancer Sister    Neuropathy Sister        "that's because she drinks"    No Known Allergies  Medication list has been reviewed and updated.  Current Outpatient Medications on File Prior to Visit  Medication Sig Dispense Refill   FLUoxetine (PROZAC) 20 MG tablet Take 1 tablet (20 mg total) by mouth daily. Increase to 40 mg after 1-2 weeks 69 tablet 3   Zoster Vaccine Adjuvanted [facility]) injection Inject into the muscle. 1 each 0   No current facility-administered medications on file prior to visit.    Review of Systems:  As per HPI- otherwise negative.   Physical Examination: There were no vitals filed for this visit. There were no vitals filed for this visit. There is no height or weight on file to  calculate BMI. Ideal Body Weight:   Patient observed via video monitor.  She looks well, no distress   Assessment and Plan: Adjustment disorder with mixed anxiety and depressed mood  Patient seen today for adjustment disorder follow-up.  As above, I started her on fluoxetine a couple weeks ago.  However, she did not like the way this medication made her feel and stopped taking it.  She is seeing a Social worker and is taking a leave of absence from work.  She is sleeping much better and getting more exercise Overall, Hailey Bryant feels that she is doing well and is making good progress.   She does not wish to start different medication at this time.  She will let me know how else I can help her in the future.  She has discussed her work situation with her supervisor and they plan to reassess things when she returns to work at the end of her leave  Signed Lamar Blinks, MD

## 2022-03-01 ENCOUNTER — Telehealth (INDEPENDENT_AMBULATORY_CARE_PROVIDER_SITE_OTHER): Payer: 59 | Admitting: Family Medicine

## 2022-03-01 DIAGNOSIS — F4323 Adjustment disorder with mixed anxiety and depressed mood: Secondary | ICD-10-CM

## 2022-03-15 ENCOUNTER — Encounter: Payer: Self-pay | Admitting: Family Medicine

## 2022-03-23 ENCOUNTER — Encounter: Payer: Self-pay | Admitting: Family Medicine

## 2022-03-31 ENCOUNTER — Encounter: Payer: 59 | Admitting: Family Medicine

## 2022-04-04 NOTE — Patient Instructions (Signed)
It was good to see you today- I will be in touch with your labs asap  2nd dose of shingrix vaccine today Ordered mammo- please schedule asap Recommend flu shot and covid booster if needed this fall   Keep up the good work with exercise

## 2022-04-04 NOTE — Progress Notes (Unsigned)
[place] at [facility] [address] [place], [place] [address] [phone]  Date:  04/07/2022   Name:  Hailey Bryant   DOB:  11/16/1965   MRN:  [id number]  PCP:  Darreld Mclean, MD    Chief Complaint: No chief complaint on file.   History of Present Illness:  Hailey Bryant is a 56 y.o. very pleasant female patient who presents with the following:  Pt seen today for a CPE Last seen by myself virtually in July at which time she was under a lot of stress esp from her job She had tried Fluoxetine but it did not agree w her. She was taking a LOA from her job  Shingrix Covid booster Pap- last done 2019 Flu shot  Labs one year ago  Mammo one year ago  Colon 2022   Patient Active Problem List   Diagnosis Date Noted   Numbness and tingling of right leg 09/09/2020   Pre-diabetes 05/14/2015   History of cervical cancer 02/26/2015   Abnormal Pap smear of cervix 07/24/2014    Past Medical History:  Diagnosis Date   Colon polyps     Past Surgical History:  Procedure Laterality Date   ABDOMINAL HYSTERECTOMY  1998   KNEE SURGERY      Social History   Tobacco Use   Smoking status: Never   Smokeless tobacco: Never  Vaping Use   Vaping Use: Never used  Substance Use Topics   Alcohol use: Never    Alcohol/week: 0.0 standard drinks of alcohol   Drug use: Never    Family History  Problem Relation Age of Onset   Hypertension Mother    Cancer Sister    Breast cancer Sister    Neuropathy Sister        "that's because she drinks"    No Known Allergies  Medication list has been reviewed and updated.  Current Outpatient Medications on File Prior to Visit  Medication Sig Dispense Refill   Zoster Vaccine Adjuvanted [facility]) injection Inject into the muscle. 1 each 0   No current facility-administered medications on file prior to visit.    Review of Systems:  As per HPI- otherwise  negative.   Physical Examination: There were no vitals filed for this visit. There were no vitals filed for this visit. There is no height or weight on file to calculate BMI. Ideal Body Weight:    GEN: no acute distress. HEENT: Atraumatic, Normocephalic.  Ears and Nose: No external deformity. CV: RRR, No M/G/R. No JVD. No thrill. No extra heart sounds. PULM: CTA B, no wheezes, crackles, rhonchi. No retractions. No resp. distress. No accessory muscle use. ABD: S, NT, ND, +BS. No rebound. No HSM. EXTR: No c/c/e PSYCH: Normally interactive. Conversant.    Assessment and Plan: *** Physical exam today Encouraged healthy diet and exercise routine   Signed Lamar Blinks, MD

## 2022-04-07 ENCOUNTER — Encounter: Payer: Self-pay | Admitting: Family Medicine

## 2022-04-07 ENCOUNTER — Ambulatory Visit (INDEPENDENT_AMBULATORY_CARE_PROVIDER_SITE_OTHER): Payer: 59 | Admitting: Family Medicine

## 2022-04-07 VITALS — BP 132/80 | HR 61 | Temp 97.8°F | Resp 18 | Ht 68.0 in | Wt 208.8 lb

## 2022-04-07 DIAGNOSIS — R5383 Other fatigue: Secondary | ICD-10-CM | POA: Diagnosis not present

## 2022-04-07 DIAGNOSIS — Z13220 Encounter for screening for lipoid disorders: Secondary | ICD-10-CM

## 2022-04-07 DIAGNOSIS — Z23 Encounter for immunization: Secondary | ICD-10-CM | POA: Diagnosis not present

## 2022-04-07 DIAGNOSIS — Z1231 Encounter for screening mammogram for malignant neoplasm of breast: Secondary | ICD-10-CM

## 2022-04-07 DIAGNOSIS — Z1329 Encounter for screening for other suspected endocrine disorder: Secondary | ICD-10-CM

## 2022-04-07 DIAGNOSIS — D649 Anemia, unspecified: Secondary | ICD-10-CM

## 2022-04-07 DIAGNOSIS — Z130 Encounter for screening for diseases of the blood and blood-forming organs and certain disorders involving the immune mechanism: Secondary | ICD-10-CM

## 2022-04-07 DIAGNOSIS — Z0000 Encounter for general adult medical examination without abnormal findings: Secondary | ICD-10-CM | POA: Diagnosis not present

## 2022-04-07 DIAGNOSIS — R7303 Prediabetes: Secondary | ICD-10-CM | POA: Diagnosis not present

## 2022-04-07 LAB — COMPREHENSIVE METABOLIC PANEL
ALT: 13 U/L (ref 0–35)
AST: 20 U/L (ref 0–37)
Albumin: 3.8 g/dL (ref 3.5–5.2)
Alkaline Phosphatase: 67 U/L (ref 39–117)
BUN: 18 mg/dL (ref 6–23)
CO2: 29 mEq/L (ref 19–32)
Calcium: 9.3 mg/dL (ref 8.4–10.5)
Chloride: 105 mEq/L (ref 96–112)
Creatinine, Ser: 0.72 mg/dL (ref 0.40–1.20)
GFR: 92.9 mL/min (ref >60.00)
Glucose, Bld: 84 mg/dL (ref 70–99)
Potassium: 4.3 mEq/L (ref 3.5–5.1)
Sodium: 140 mEq/L (ref 135–145)
Total Bilirubin: 0.5 mg/dL (ref 0.2–1.2)
Total Protein: 7.1 g/dL (ref 6.0–8.3)

## 2022-04-07 LAB — LIPID PANEL
Cholesterol: 238 mg/dL — ABNORMAL HIGH (ref 0–200)
HDL: 88.5 mg/dL (ref >39.00)
LDL Cholesterol: 138 mg/dL — ABNORMAL HIGH (ref 0–99)
NonHDL: 149.51
Total CHOL/HDL Ratio: 3
Triglycerides: 56 mg/dL (ref 0.0–149.0)
VLDL: 11.2 mg/dL (ref 0.0–40.0)

## 2022-04-07 LAB — CBC
HCT: 35.7 % — ABNORMAL LOW (ref 36.0–46.0)
Hemoglobin: 11.8 g/dL — ABNORMAL LOW (ref 12.0–15.0)
MCHC: 33.1 g/dL (ref 30.0–36.0)
MCV: 85 fl (ref 78.0–100.0)
Platelets: 299 10*3/uL (ref 150.0–400.0)
RBC: 4.2 Mil/uL (ref 3.87–5.11)
RDW: 13.9 % (ref 11.5–15.5)
WBC: 4.4 10*3/uL (ref 4.0–10.5)

## 2022-04-07 LAB — VITAMIN D 25 HYDROXY (VIT D DEFICIENCY, FRACTURES): VITD: 28.44 ng/mL — ABNORMAL LOW (ref 30.00–100.00)

## 2022-04-07 LAB — TSH: TSH: 0.99 u[IU]/mL (ref 0.35–5.50)

## 2022-04-07 LAB — HEMOGLOBIN A1C: Hgb A1c MFr Bld: 6 % (ref 4.6–6.5)

## 2022-05-12 ENCOUNTER — Ambulatory Visit (HOSPITAL_BASED_OUTPATIENT_CLINIC_OR_DEPARTMENT_OTHER)
Admission: RE | Admit: 2022-05-12 | Discharge: 2022-05-12 | Disposition: A | Discharge status: Home / Self Care | Payer: 59 | Source: Ambulatory Visit | Attending: Family Medicine | Admitting: Family Medicine

## 2022-05-12 ENCOUNTER — Encounter (HOSPITAL_BASED_OUTPATIENT_CLINIC_OR_DEPARTMENT_OTHER): Payer: Self-pay

## 2022-05-12 DIAGNOSIS — Z1231 Encounter for screening mammogram for malignant neoplasm of breast: Secondary | ICD-10-CM | POA: Diagnosis present

## 2022-08-11 ENCOUNTER — Ambulatory Visit (HOSPITAL_COMMUNITY)
Admission: EM | Admit: 2022-08-11 | Discharge: 2022-08-11 | Disposition: A | Discharge status: Home / Self Care | Payer: 59 | Attending: Family Medicine | Admitting: Family Medicine

## 2022-08-11 ENCOUNTER — Encounter (HOSPITAL_COMMUNITY): Payer: Self-pay | Admitting: Emergency Medicine

## 2022-08-11 ENCOUNTER — Other Ambulatory Visit: Payer: Self-pay

## 2022-08-11 DIAGNOSIS — M7661 Achilles tendinitis, right leg: Secondary | ICD-10-CM | POA: Diagnosis not present

## 2022-08-11 MED ORDER — DICLOFENAC SODIUM 75 MG PO TBEC: 75.0000 mg | DELAYED_RELEASE_TABLET | Freq: Two times a day (BID) | ORAL | 0 refills | Status: DC

## 2022-08-11 NOTE — ED Triage Notes (Signed)
Right ankle and foot are swollen and painful.  Patient has taken benadryl.  Patient does not recall any injury, no fall or twisting of foot or ankle.  Sunday noticed pain.

## 2022-08-11 NOTE — ED Provider Notes (Signed)
[place]   [id number] 08/11/22 Arrival Time: 2542  ASSESSMENT & PLAN:  1. Tendonitis, Achilles, right    Begin: New Prescriptions   DICLOFENAC (VOLTAREN) 75 MG EC TABLET    Take 1 tablet (75 mg total) by mouth 2 (two) times daily.   Limt WB as she can over the next one week. CAM boot for when she is bearing weight. Orders Placed This Encounter  Procedures   Apply CAM boot   Recommend:  Follow-up Information     [place], Gay Filler, MD.   Specialty: Family Medicine Why: As needed. Contact information: [place] [address] [phone]         [facility], [facility].   Specialty: Specialist Why: If worsening or failing to improve as anticipated. Contact information: [place] [address] [phone]                Reviewed expectations re: course of current medical issues. Questions answered. Outlined signs and symptoms indicating need for more acute intervention. Patient verbalized understanding. After Visit Summary given.  SUBJECTIVE: History from: patient. Hailey Bryant is a 57 y.o. female who reports pain and swelling over R posterior ankle into foot; grad onset; x sev days. No trauma/injury. Has been on feet at work more then usual lately. Pain worse with ankle movement. No extremity sensation changes or weakness.    Past Surgical History:  Procedure Laterality Date   ABDOMINAL HYSTERECTOMY  1998   KNEE SURGERY        OBJECTIVE:  Vitals:   08/11/22 1558  BP: 126/82  Pulse: 76  Resp: 18  Temp: 98.2 F (36.8 C)  TempSrc: Oral  SpO2: 98%    General appearance: alert; no distress HEENT: [place]; AT Neck: supple with FROM Resp: unlabored respirations Extremities: RLE: warm with well perfused appearance; well localized marked tenderness and stiffness over right distal Achilles tendon with overlying erythema/swelling CV: brisk extremity capillary refill of RLE; 2+ DP pulse of  RLE. Skin: warm and dry; no visible rashes Neurologic: normal sensation and strength of RLE Psychological: alert and cooperative; normal mood and affect  No Known Allergies  Past Medical History:  Diagnosis Date   Colon polyps    Social History   Socioeconomic History   Marital status: Married    Spouse name: Not on file   Number of children: 3   Years of education: Not on file   Highest education level: Some college, no degree  Occupational History   Occupation: Therapist, art  Tobacco Use   Smoking status: Never   Smokeless tobacco: Never  Vaping Use   Vaping Use: Never used  Substance and Sexual Activity   Alcohol use: Never    Alcohol/week: 0.0 standard drinks of alcohol   Drug use: Never   Sexual activity: Not on file  Other Topics Concern   Not on file  Social History Narrative   Lives at home with husband   Right handed   Caffeine: 8 oz three times a week at the most    Social Determinants of Health   Financial Resource Strain: Not on file  Food Insecurity: Not on file  Transportation Needs: Not on file  Physical Activity: Not on file  Stress: Not on file  Social Connections: Not on file   Family History  Problem Relation Age of Onset   Hypertension Mother    Cancer Sister    Breast cancer Sister  Neuropathy Sister        "that's because she drinks"   Past Surgical History:  Procedure Laterality Date   [place]         Vanessa Kick, MD 08/11/22 1630

## 2022-09-12 IMAGING — MG MM DIGITAL SCREENING BILAT W/ TOMO AND CAD
8 series · 8 of 24 positions shown · non-contrast
Comparison: Previous exam(s).

ACR Breast Density Category a: The breast tissue is almost entirely
fatty.

CLINICAL DATA: Screening.

EXAM:
DIGITAL SCREENING BILATERAL MAMMOGRAM WITH TOMOSYNTHESIS AND CAD
TECHNIQUE: Bilateral screening digital craniocaudal and mediolateral oblique
mammograms were obtained. Bilateral screening digital breast
tomosynthesis was performed. The images were evaluated with
computer-aided detection.

[R MLO synth-2D]
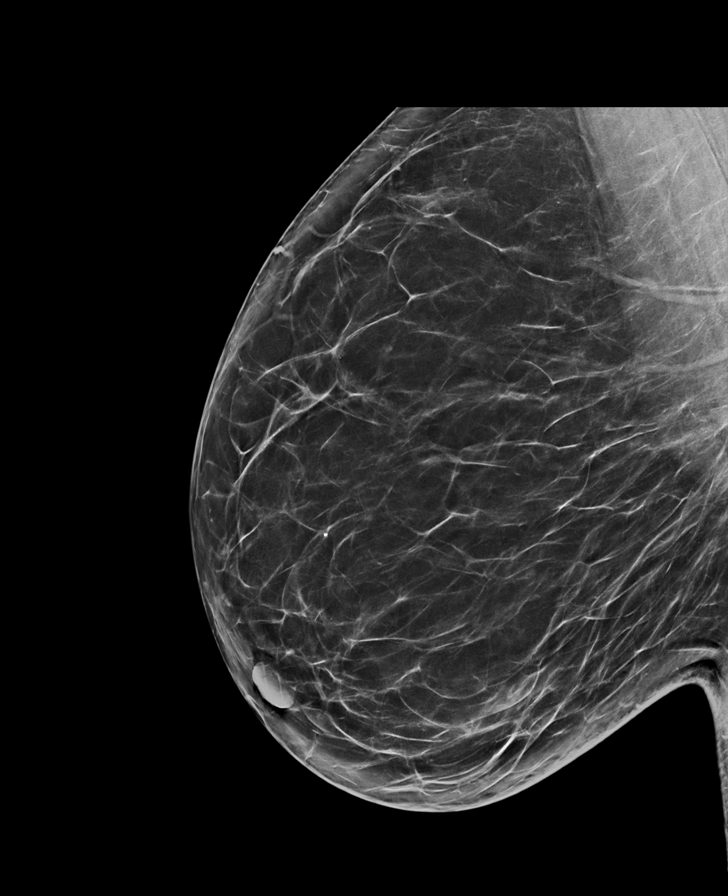

[L MLO synth-2D]
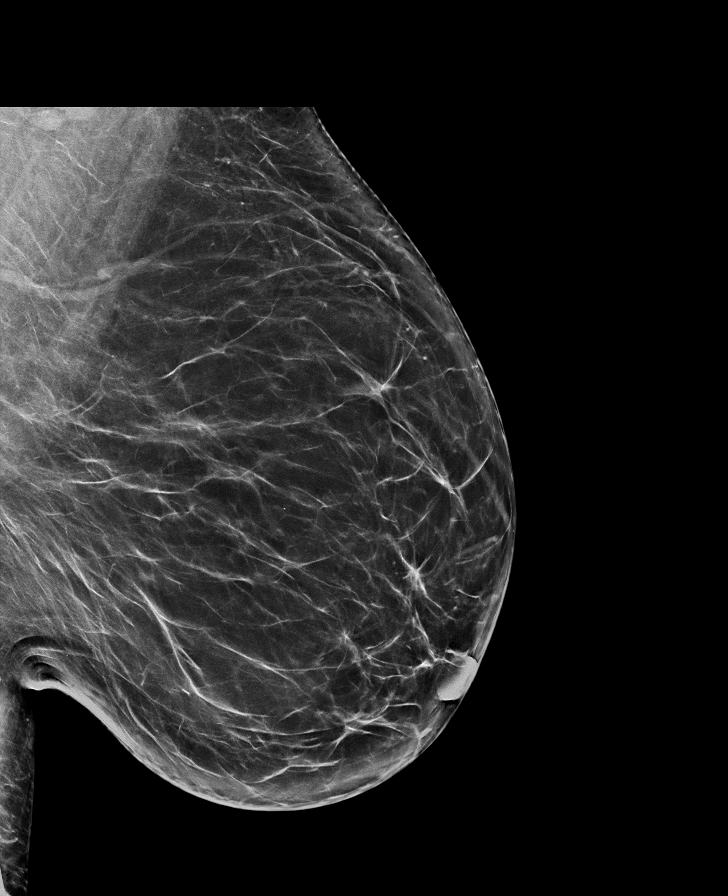

[R CC synth-2D]
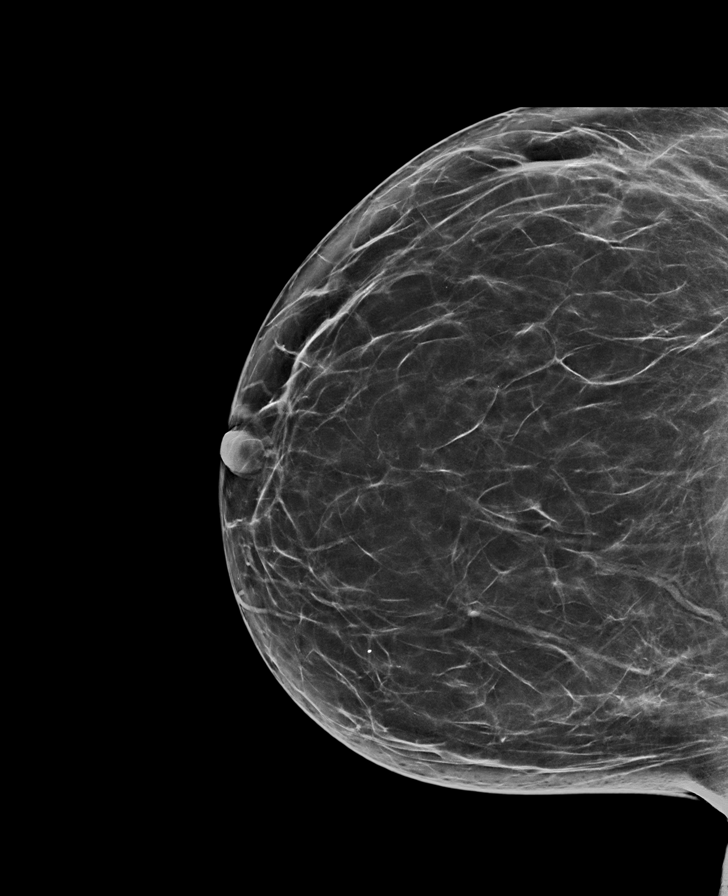

[L CC synth-2D]
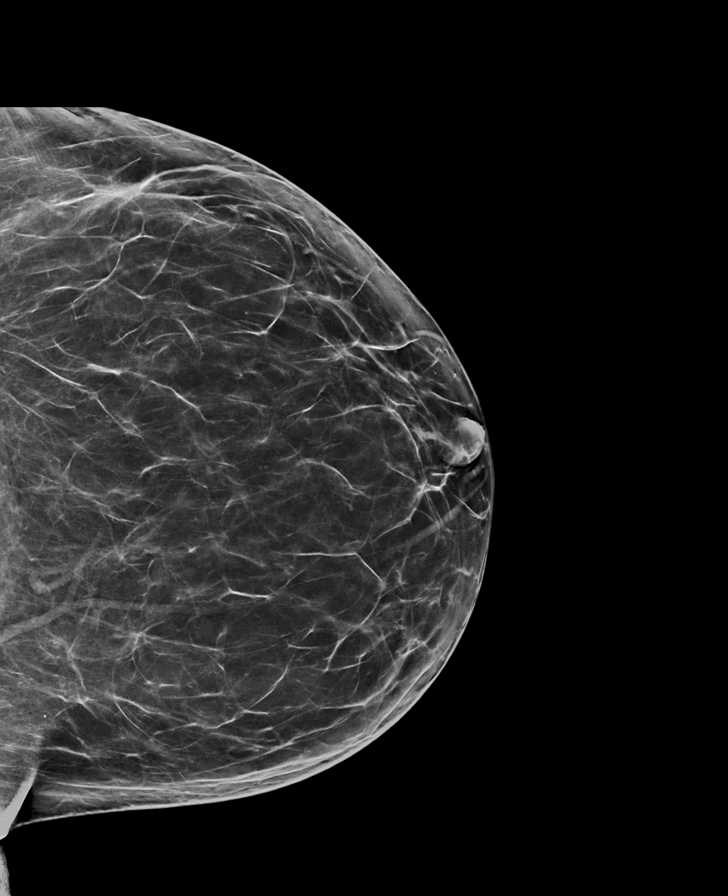

[R MLO tomo · tomo slice 43/86.0]
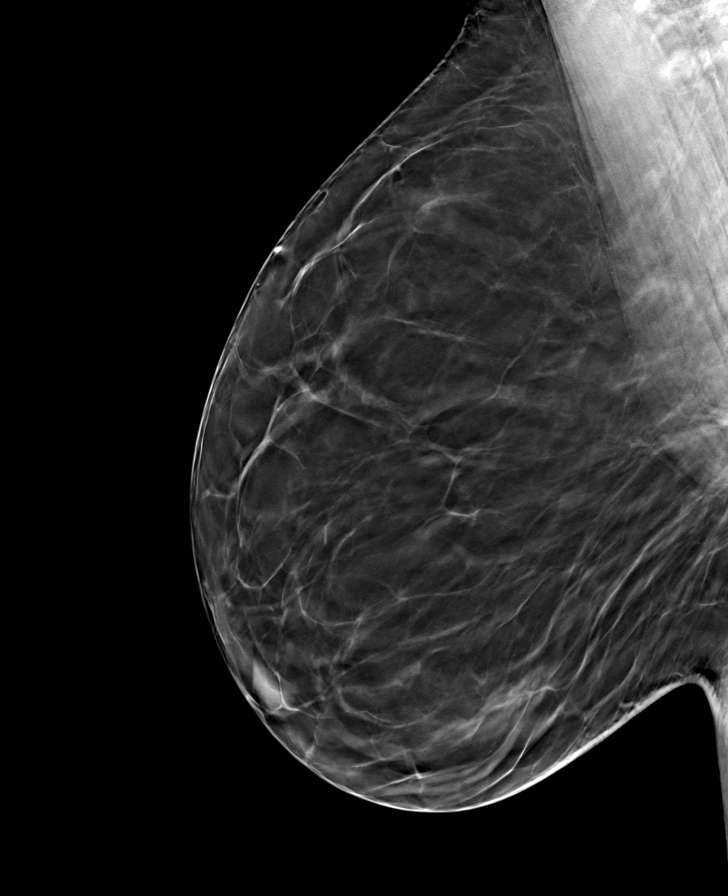

[L MLO tomo · tomo slice 45/89.0]
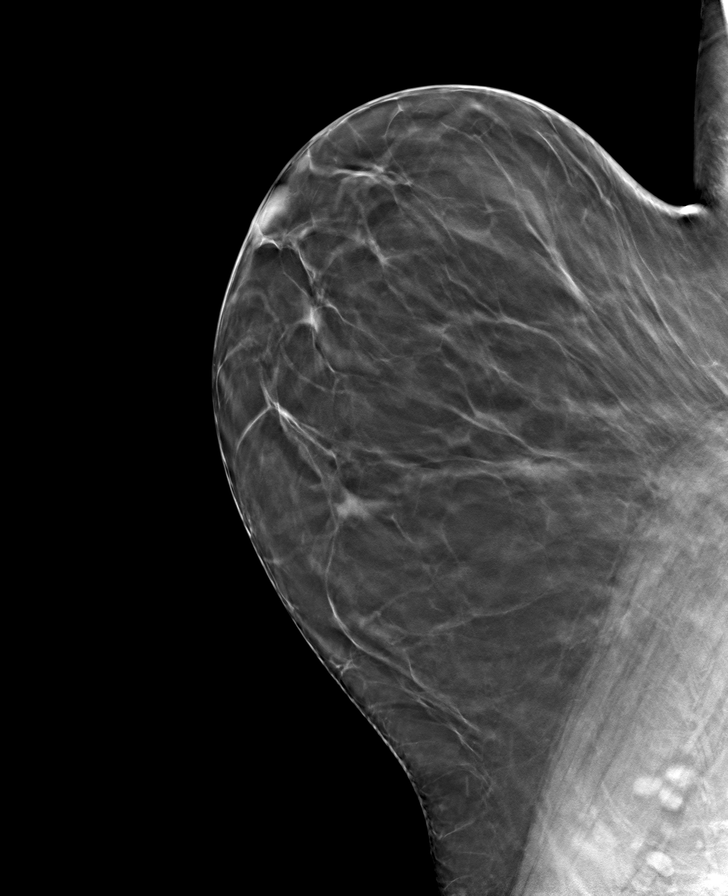

[R CC tomo · tomo slice 39/78.0]
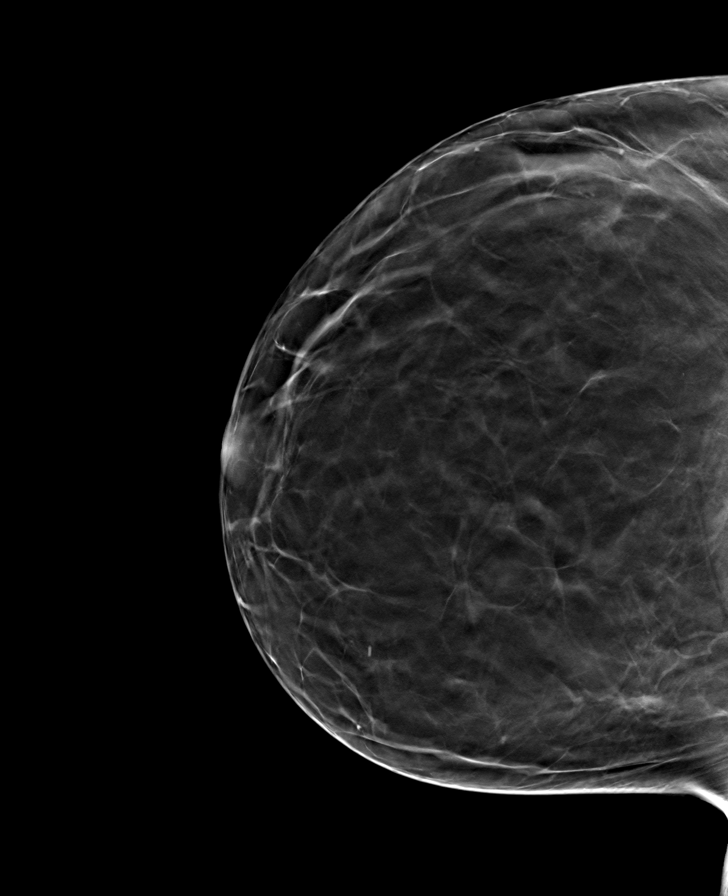

[L CC tomo · tomo slice 41/80.0]
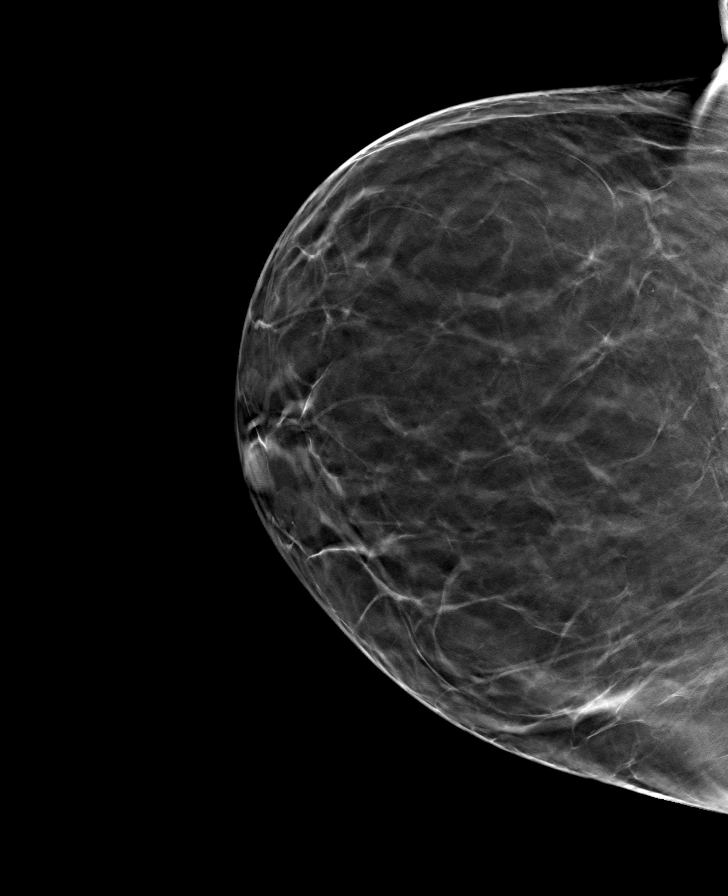

[8 of 24 positions shown; findings below may reference images not displayed]

FINDINGS: There are no findings suspicious for malignancy.
IMPRESSION: No mammographic evidence of malignancy. A result letter of this
screening mammogram will be mailed directly to the patient.

RECOMMENDATION:
Screening mammogram in one year. (Code:0E-3-N98)

BI-RADS CATEGORY  1: Negative.

## 2022-11-17 ENCOUNTER — Ambulatory Visit (HOSPITAL_COMMUNITY)
Admission: EM | Admit: 2022-11-17 | Discharge: 2022-11-17 | Disposition: A | Discharge status: Home / Self Care | Payer: 59 | Attending: Emergency Medicine | Admitting: Emergency Medicine

## 2022-11-17 ENCOUNTER — Encounter (HOSPITAL_COMMUNITY): Payer: Self-pay

## 2022-11-17 DIAGNOSIS — R300 Dysuria: Secondary | ICD-10-CM

## 2022-11-17 DIAGNOSIS — N898 Other specified noninflammatory disorders of vagina: Secondary | ICD-10-CM

## 2022-11-17 DIAGNOSIS — M7662 Achilles tendinitis, left leg: Secondary | ICD-10-CM | POA: Diagnosis not present

## 2022-11-17 LAB — POCT URINALYSIS DIP (MANUAL ENTRY)
Bilirubin, UA: NEGATIVE
Glucose, UA: NEGATIVE mg/dL
Ketones, POC UA: NEGATIVE mg/dL
Nitrite, UA: NEGATIVE
Protein Ur, POC: NEGATIVE mg/dL
Spec Grav, UA: 1.015 (ref 1.010–1.025)
Urobilinogen, UA: 0.2 E.U./dL
pH, UA: 7 (ref 5.0–8.0)

## 2022-11-17 LAB — HIV ANTIBODY (ROUTINE TESTING W REFLEX): HIV Screen 4th Generation wRfx: NONREACTIVE

## 2022-11-17 MED ORDER — DICLOFENAC SODIUM 75 MG PO TBEC: 75.0000 mg | DELAYED_RELEASE_TABLET | Freq: Two times a day (BID) | ORAL | 0 refills | Status: AC

## 2022-11-17 NOTE — ED Provider Notes (Signed)
[facility]    CSN: [id number] Arrival date & time: 11/17/22  1053      History   Chief Complaint Chief Complaint  Patient presents with   Ankle Pain   SEXUALLY TRANSMITTED DISEASE    HPI Hailey Bryant is a 57 y.o. female.   Patient presents to clinic for ankle and left heel pain since yesterday.  She denies any injuries, twisting or falls.  She has a history of Achilles tendinitis to her right ankle and this feels similar.  She does have swelling on exam.  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  She also reports some vaginal discharge and dysuria that started yesterday.  She is sexually active and would like sexually transmitted infection screening.    The history is provided by the patient and medical records.  Ankle Pain Associated symptoms: no fatigue and no fever     Past Medical History:  Diagnosis Date   Colon polyps     Patient Active Problem List   Diagnosis Date Noted   Numbness and tingling of right leg 09/09/2020   Pre-diabetes 05/14/2015   History of cervical cancer 02/26/2015   Abnormal Pap smear of cervix 07/24/2014    Past Surgical History:  Procedure Laterality Date   ABDOMINAL HYSTERECTOMY  1998   KNEE SURGERY      OB History   No obstetric history on file.      Home Medications    Prior to Admission medications   Medication Sig Start Date End Date Taking? Authorizing Provider  metFORMIN (GLUCOPHAGE-XR) 500 MG 24 hr tablet Take 500 mg by mouth 2 (two) times daily. 11/16/22  Yes [provider]  diclofenac (VOLTAREN) 75 MG EC tablet Take 1 tablet (75 mg total) by mouth 2 (two) times daily for 10 days. 11/17/22 11/27/22 Yes Hiilani Jetter, [place] N, FNP    Family History Family History  Problem Relation Age of Onset   Hypertension Mother    Cancer Sister    Breast cancer Sister    Neuropathy Sister        "that's because she drinks"    Social History Social  History   Tobacco Use   Smoking status: Never   Smokeless tobacco: Never  Vaping Use   Vaping Use: Never used  Substance Use Topics   Alcohol use: Never    Alcohol/week: 0.0 standard drinks of alcohol   Drug use: Never     Allergies   Patient has no known allergies.   Review of Systems Review of Systems  Constitutional:  Negative for chills, fatigue and fever.  Respiratory:  Negative for cough and shortness of breath.   Cardiovascular:  Negative for chest pain.  Gastrointestinal:  Negative for abdominal pain, diarrhea, nausea and vomiting.  Genitourinary:  Positive for dysuria and vaginal discharge.  Musculoskeletal:  Positive for joint swelling.     Physical Exam Triage Vital Signs ED Triage Vitals [11/17/22 1229]  Enc Vitals Group     BP (!) 162/83     Pulse Rate 75     Resp      Temp 98.7 F (37.1 C)     Temp Source Oral     SpO2 99 %     Weight      Height      Head Circumference      Peak Flow      Pain Score 8  Pain Loc      Pain Edu?      Excl. in GC?    No data found.  Updated Vital Signs BP (!) 162/83 (BP Location: Left Arm)   Pulse 75   Temp 98.7 F (37.1 C) (Oral)   SpO2 99%   Visual Acuity Right Eye Distance:   Left Eye Distance:   Bilateral Distance:    Right Eye Near:   Left Eye Near:    Bilateral Near:     Physical Exam Vitals and nursing note reviewed.  Constitutional:      Appearance: Normal appearance.  HENT:     Head: Normocephalic and atraumatic.     Right Ear: External ear normal.     Left Ear: External ear normal.     Mouth/Throat:     Mouth: Mucous membranes are moist.  Eyes:     General: No scleral icterus.       Right eye: No discharge.        Left eye: No discharge.     Conjunctiva/sclera: Conjunctivae normal.  Cardiovascular:     Rate and Rhythm: Normal rate and regular rhythm.  Pulmonary:     Effort: Pulmonary effort is normal. No respiratory distress.  Musculoskeletal:        General: Swelling and  tenderness present. No deformity or signs of injury. Normal range of motion.     Cervical back: Normal range of motion.     Right ankle: Normal.     Left ankle: Swelling present.     Left Achilles Tendon: Tenderness present.       Legs:     Comments: Swelling and tenderness to palpation of left achilles. Ambulatory. No deformity.   Skin:    General: Skin is warm and dry.     Capillary Refill: Capillary refill takes less than 2 seconds.  Neurological:     Mental Status: She is alert and oriented to person, place, and time.  Psychiatric:        Mood and Affect: Mood normal.        Behavior: Behavior normal. Behavior is cooperative.      UC Treatments / Results  Labs (all labs ordered are listed, but only abnormal results are displayed) Labs Reviewed  POCT URINALYSIS DIP (MANUAL ENTRY) - Abnormal; Notable for the following components:      Result Value   Blood, UA moderate (*)    Leukocytes, UA Large (3+) (*)    All other components within normal limits  URINE CULTURE  RPR  HIV ANTIBODY (ROUTINE TESTING W REFLEX)  CERVICOVAGINAL ANCILLARY ONLY    EKG   Radiology No results found.  Procedures Procedures (including critical care time)  Medications Ordered in UC Medications - No data to display  Initial Impression / Assessment and Plan / UC Course  I have reviewed the triage vital signs and the nursing notes.  Pertinent labs & imaging results that were available during my care of the patient were reviewed by me and considered in my medical decision making (see chart for details).  Vitals and triage reviewed, patient is hemodynamically stable.  Swelling and tenderness to palpation of left Achilles, negative Thompson test.  Suspect Achilles tendinitis to left Achilles.  Will treat with diclofenac sodium, as this worked well previously.  Encouraged proper orthotics and Achilles rehab.  Ortho follow-up as needed.  Patient also reports vaginal discharge and dysuria.   Urinalysis in clinic positive for RBC and leukocytes, w/o CVA tenderness, fever or  flank pain. Will send for culture.  Cytology and labs obtained, will contact patient if positive and initiate the appropriate treatment.  Patient verbalized understanding, no questions at this time.     Final Clinical Impressions(s) / UC Diagnoses   Final diagnoses:  Achilles tendinitis of left lower extremity  Vaginal discharge  Dysuria     Discharge Instructions      You appear to have Achilles tendinitis flare in your left Achilles.  Please ensure you are wearing supportive shoes, you can do over-the-counter shoe inserts from Dr. ScholMargart Sickleso help prevent this in the future.  Please look at the exercises, he can do them for 20 or 30 seconds each and repeat 5 times, you can do this multiple times throughout the day while you are at work as well.  Please follow-up with [facility] if your symptoms persist.  We have tested you for bacterial vaginosis, yeast and sexually transmitted infections today in clinic.  We will call once the swab results if anything is positive and initiate the appropriate treatment. Your urine was without obvious infection, we are sending it for culture and will call if we need to start you on antibiotics for a UTI. Please do not have sex until all results are received.        ED Prescriptions     Medication Sig Dispense Auth. Provider   diclofenac (VOLTAREN) 75 MG EC tablet Take 1 tablet (75 mg total) by mouth 2 (two) times daily for 10 days. 20 tablet Teshawn Moan, [place] N, [place]      PDMP not reviewed this encounter.   Verdis Bassette, [place] N, [place] 11/17/22 1310

## 2022-11-17 NOTE — Discharge Instructions (Addendum)
You appear to have Achilles tendinitis flare in your left Achilles.  Please ensure you are wearing supportive shoes, you can do over-the-counter shoe inserts from Dr. Margart Sickles to help prevent this in the future.  Please look at the exercises, he can do them for 20 or 30 seconds each and repeat 5 times, you can do this multiple times throughout the day while you are at work as well.  Please follow-up with [facility] if your symptoms persist.  We have tested you for bacterial vaginosis, yeast and sexually transmitted infections today in clinic.  We will call once the swab results if anything is positive and initiate the appropriate treatment. Your urine was without obvious infection, we are sending it for culture and will call if we need to start you on antibiotics for a UTI. Please do not have sex until all results are received.

## 2022-11-17 NOTE — ED Triage Notes (Signed)
Pt c/o lt ankle pain since yesterday. Denies injury. States hx of tendinitis to rt ankle and this feels the same.   Pt c/o vaginal discharge and burning on urination since yesterday. Requesting STD testing.

## 2022-11-18 LAB — URINE CULTURE: Culture: NO GROWTH

## 2022-11-18 LAB — CERVICOVAGINAL ANCILLARY ONLY
Bacterial Vaginitis (gardnerella): NEGATIVE
Candida Glabrata: NEGATIVE
Candida Vaginitis: NEGATIVE
Chlamydia: NEGATIVE
Comment: NEGATIVE
Comment: NEGATIVE
Comment: NEGATIVE
Comment: NEGATIVE
Comment: NEGATIVE
Comment: NORMAL
Neisseria Gonorrhea: NEGATIVE
Trichomonas: NEGATIVE

## 2022-11-18 LAB — RPR: RPR Ser Ql: NONREACTIVE

## 2023-02-25 ENCOUNTER — Other Ambulatory Visit (HOSPITAL_BASED_OUTPATIENT_CLINIC_OR_DEPARTMENT_OTHER): Payer: Self-pay | Admitting: Family Medicine

## 2023-02-25 DIAGNOSIS — Z1231 Encounter for screening mammogram for malignant neoplasm of breast: Secondary | ICD-10-CM

## 2023-03-17 ENCOUNTER — Encounter (INDEPENDENT_AMBULATORY_CARE_PROVIDER_SITE_OTHER): Payer: Self-pay

## 2023-04-10 NOTE — Progress Notes (Unsigned)
[facility] [address] [place], [place] [address] [phone]  Date:  04/13/2023   Name:  Hailey Bryant   DOB:  [birth year]   MRN:  [id number]  PCP:  Pearline Cables, MD    Chief Complaint: No chief complaint on file.   History of Present Illness:  Hailey Bryant is a 57 y.o. very pleasant female patient who presents with the following:  Patient seen today for physical exam-history of prediabetes, cervical cancer Most recent visit with myself was about 1 year ago, also for physical She was seen in urgent care in January and again in April with an Achilles tendon issue  She typically exercise at the gym Most recent Pap was in 2019-she is status post hysterectomy but there was a question of possible abnormal cervical cells.  At her visit last year she had wanted to defer Pap until today for 5-year follow-up I do not have her operative report from hysterectomy as it was done about 25 years ago  Flu shot Recommend COVID booster Mammogram is scheduled for October Colonoscopy is up-to-date Shingrix is complete  Taking metformin for prediabetes Most recent labs 1 year ago Lab Results  Component Value Date   HGBA1C 6.0 04/07/2022      Patient Active Problem List   Diagnosis Date Noted   Numbness and tingling of right leg 09/09/2020   Pre-diabetes 05/14/2015   History of cervical cancer 02/26/2015   Abnormal Pap smear of cervix 07/24/2014    Past Medical History:  Diagnosis Date   Colon polyps     Past Surgical History:  Procedure Laterality Date   ABDOMINAL HYSTERECTOMY  1998   KNEE SURGERY      Social History   Tobacco Use   Smoking status: Never   Smokeless tobacco: Never  Vaping Use   Vaping status: Never Used  Substance Use Topics   Alcohol use: Never    Alcohol/week: 0.0 standard drinks of alcohol   Drug use: Never    Family History  Problem Relation Age of Onset    Hypertension Mother    Cancer Sister    Breast cancer Sister    Neuropathy Sister        "that's because she drinks"    No Known Allergies  Medication list has been reviewed and updated.  Current Outpatient Medications on File Prior to Visit  Medication Sig Dispense Refill   metFORMIN (GLUCOPHAGE-XR) 500 MG 24 hr tablet Take 500 mg by mouth 2 (two) times daily.     No current facility-administered medications on file prior to visit.    Review of Systems:  As per HPI- otherwise negative.   Physical Examination: There were no vitals filed for this visit. There were no vitals filed for this visit. There is no height or weight on file to calculate BMI. Ideal Body Weight:    GEN: no acute distress. HEENT: Atraumatic, Normocephalic.  Ears and Nose: No external deformity. CV: RRR, No M/G/R. No JVD. No thrill. No extra heart sounds. PULM: CTA B, no wheezes, crackles, rhonchi. No retractions. No resp. distress. No accessory muscle use. ABD: S, NT, ND, +BS. No rebound. No HSM. EXTR: No c/c/e PSYCH: Normally interactive. Conversant.  Pap smear collected today  Assessment and Plan: *** Physical exam today.  Encouraged healthy diet and exercise routine Will plan further follow- up pending labs.  Signed Abbe Amsterdam, MD

## 2023-04-10 NOTE — Patient Instructions (Signed)
It was great to see again today, I will be in touch with your lab work and pap report  Recommend COVID booster, flu shot this fall  Keep up the good work!  Can plan to recheck in 6-12 months

## 2023-04-11 ENCOUNTER — Encounter: Payer: 59 | Admitting: Family Medicine

## 2023-04-13 ENCOUNTER — Other Ambulatory Visit (HOSPITAL_COMMUNITY)
Admission: RE | Admit: 2023-04-13 | Discharge: 2023-04-13 | Disposition: A | Discharge status: Home / Self Care | Payer: 59 | Source: Ambulatory Visit | Attending: Family Medicine | Admitting: Family Medicine

## 2023-04-13 ENCOUNTER — Ambulatory Visit (INDEPENDENT_AMBULATORY_CARE_PROVIDER_SITE_OTHER): Payer: 59 | Admitting: Family Medicine

## 2023-04-13 ENCOUNTER — Encounter: Payer: Self-pay | Admitting: Family Medicine

## 2023-04-13 VITALS — BP 118/72 | HR 67 | Temp 98.1°F | Resp 18 | Ht 68.0 in | Wt 194.6 lb

## 2023-04-13 DIAGNOSIS — Z0000 Encounter for general adult medical examination without abnormal findings: Secondary | ICD-10-CM

## 2023-04-13 DIAGNOSIS — Z124 Encounter for screening for malignant neoplasm of cervix: Secondary | ICD-10-CM | POA: Insufficient documentation

## 2023-04-13 DIAGNOSIS — R7303 Prediabetes: Secondary | ICD-10-CM

## 2023-04-13 DIAGNOSIS — Z130 Encounter for screening for diseases of the blood and blood-forming organs and certain disorders involving the immune mechanism: Secondary | ICD-10-CM

## 2023-04-13 DIAGNOSIS — Z1329 Encounter for screening for other suspected endocrine disorder: Secondary | ICD-10-CM

## 2023-04-13 DIAGNOSIS — Z13220 Encounter for screening for lipoid disorders: Secondary | ICD-10-CM | POA: Diagnosis not present

## 2023-04-13 DIAGNOSIS — R5383 Other fatigue: Secondary | ICD-10-CM

## 2023-04-13 LAB — CBC
HCT: 38 % (ref 36.0–46.0)
Hemoglobin: 12.5 g/dL (ref 12.0–15.0)
MCHC: 32.9 g/dL (ref 30.0–36.0)
MCV: 85.5 fl (ref 78.0–100.0)
Platelets: 328 10*3/uL (ref 150.0–400.0)
RBC: 4.44 Mil/uL (ref 3.87–5.11)
RDW: 14.2 % (ref 11.5–15.5)
WBC: 4.3 10*3/uL (ref 4.0–10.5)

## 2023-04-13 LAB — COMPREHENSIVE METABOLIC PANEL
ALT: 9 U/L (ref 0–35)
AST: 15 U/L (ref 0–37)
Albumin: 4 g/dL (ref 3.5–5.2)
Alkaline Phosphatase: 75 U/L (ref 39–117)
BUN: 12 mg/dL (ref 6–23)
CO2: 30 meq/L (ref 19–32)
Calcium: 9.8 mg/dL (ref 8.4–10.5)
Chloride: 100 meq/L (ref 96–112)
Creatinine, Ser: 0.71 mg/dL (ref 0.40–1.20)
GFR: 93.8 mL/min (ref >60.00)
Glucose, Bld: 84 mg/dL (ref 70–99)
Potassium: 4.5 meq/L (ref 3.5–5.1)
Sodium: 138 meq/L (ref 135–145)
Total Bilirubin: 0.6 mg/dL (ref 0.2–1.2)
Total Protein: 7.4 g/dL (ref 6.0–8.3)

## 2023-04-13 LAB — HEMOGLOBIN A1C: Hgb A1c MFr Bld: 5.9 % (ref 4.6–6.5)

## 2023-04-13 LAB — LIPID PANEL
Cholesterol: 240 mg/dL — ABNORMAL HIGH (ref 0–200)
HDL: 90.7 mg/dL (ref >39.00)
LDL Cholesterol: 137 mg/dL — ABNORMAL HIGH (ref 0–99)
NonHDL: 148.97
Total CHOL/HDL Ratio: 3
Triglycerides: 60 mg/dL (ref 0.0–149.0)
VLDL: 12 mg/dL (ref 0.0–40.0)

## 2023-04-13 LAB — VITAMIN D 25 HYDROXY (VIT D DEFICIENCY, FRACTURES): VITD: 30.41 ng/mL (ref 30.00–100.00)

## 2023-04-13 LAB — TSH: TSH: 0.92 u[IU]/mL (ref 0.35–5.50)

## 2023-04-15 ENCOUNTER — Encounter: Payer: Self-pay | Admitting: Family Medicine

## 2023-04-21 ENCOUNTER — Encounter: Payer: Self-pay | Admitting: Family Medicine

## 2023-04-21 LAB — CYTOLOGY - PAP
Comment: NEGATIVE
Diagnosis: NEGATIVE
High risk HPV: NEGATIVE

## 2023-05-17 ENCOUNTER — Ambulatory Visit (HOSPITAL_BASED_OUTPATIENT_CLINIC_OR_DEPARTMENT_OTHER)
Admission: RE | Admit: 2023-05-17 | Discharge: 2023-05-17 | Disposition: A | Discharge status: Home / Self Care | Payer: 59 | Source: Ambulatory Visit | Attending: Family Medicine | Admitting: Family Medicine

## 2023-05-17 ENCOUNTER — Encounter (HOSPITAL_BASED_OUTPATIENT_CLINIC_OR_DEPARTMENT_OTHER): Payer: Self-pay

## 2023-05-17 DIAGNOSIS — Z1231 Encounter for screening mammogram for malignant neoplasm of breast: Secondary | ICD-10-CM | POA: Insufficient documentation

## 2024-04-17 NOTE — Patient Instructions (Addendum)
 It was great to see you again today I will be in touch with your labs If not done already recommend flu shot this fall - done! and also a COVID booster Tetanus updated today Will send order in for PT Keep up the good work with exercise.  Strength training is also an excellent idea  Assuming all is well please see me in about one year

## 2024-04-17 NOTE — Progress Notes (Signed)
 [facility] [address] [place], [place] [address] [phone]  Date:  04/23/2024   Name:  DEMIA Bryant   DOB:  [birth year]   MRN:  [id number]  PCP:  Watt Harlene BROCKS, MD    Chief Complaint: Annual Exam   History of Present Illness:  Hailey Bryant is a 58 y.o. very pleasant female patient who presents with the following:  Patient seen today for her annual physical exam.  I saw her most recently about a year ago, also for physical -history of prediabetes, cervical cancer  She had a hysterectomy in the 1990s, there is a question of possible abnormal cervical cells at that time.  We did update a Pap of the vaginal cuff for her last year.  Unfortunately her previous op report, pathology is not available due to her hysterectomy being so long ago She has been dealing with some lumbar spine issues, she had an MRI in May 2025  Flu shot- done just now  Recommend COVID booster this fall Tetanus will be due in December, can update today if she would like Colon cancer screening-colonoscopy 2022, 7- 10 recheck  Mammogram due next month Vaginal cuff Pap 2024, negative Labs updated 1 year ago  No current medications  Discussed the use of AI scribe software for clinical note transcription with the patient, who gave verbal consent to proceed.  History of Present Illness Hailey Bryant is a 58 year old female who presents for a routine follow-up and physical therapy referral for left leg and hip issues.  She experiences ongoing weakness in her left leg and hip, which affects her ability to climb stairs. An MRI in May 2025 was performed on her lumbar spine. She prefers to manage her condition with physical therapy and strength training, avoiding cortisone injections or surgery.  She engages in physical activities such as running, hiking, and using a stationary bike. She notes improvement, as she can now walk up to two and a  half miles.  Her past medical history includes a colonoscopy in 2022, which is not due for another seven to ten years, and a normal vaginal cuff Pap smear last year. She received her flu shot today and is due for a tetanus booster. She has completed her shingles vaccine.  She plans to schedule a mammogram, which is due next month. She lives near [place] and has previously attended physical therapy there, which she found expensive and inconvenient. MRI 5/25: IMPRESSION:  L3-L4: Small annular disc bulge.   L4-L5: Moderate annular disc bulge, moderate central canal stenosis, mild right foraminal stenosis.  co  Patient Active Problem List   Diagnosis Date Noted   Numbness and tingling of right leg 09/09/2020   Pre-diabetes 05/14/2015   History of cervical cancer 02/26/2015   Abnormal Pap smear of cervix 07/24/2014    Past Medical History:  Diagnosis Date   Colon polyps     Past Surgical History:  Procedure Laterality Date   ABDOMINAL HYSTERECTOMY  1998   KNEE SURGERY      Social History   Tobacco Use   Smoking status: Never   Smokeless tobacco: Never  Vaping Use   Vaping status: Never Used  Substance Use Topics   Alcohol use: Never    Alcohol/week: 0.0 standard drinks of alcohol   Drug use: Never    Family History  Problem Relation Age of Onset   Hypertension Mother  Cancer Sister    Breast cancer Sister    Neuropathy Sister        that's because she drinks    No Known Allergies  Medication list has been reviewed and updated.  No current outpatient medications on file prior to visit.   No current facility-administered medications on file prior to visit.    Review of Systems:  As per HPI- otherwise negative.   Physical Examination: Vitals:   04/23/24 0848  BP: 134/80  Pulse: 60  Temp: 98.3 F (36.8 C)  SpO2: 98%   Vitals:   04/23/24 0848  Weight: 198 lb 3.2 oz (89.9 kg)  Height: 5' 8 (1.727 m)   Body mass index is 30.14 kg/m. Ideal Body  Weight: Weight in (lb) to have BMI = 25: 164.1  GEN: no acute distress.  Mild obesity, looks well  HEENT: Atraumatic, Normocephalic. Bilateral TM wnl, oropharynx normal.  PEERL,EOMI.   Ears and Nose: No external deformity. CV: RRR, No M/G/R. No JVD. No thrill. No extra heart sounds. PULM: CTA B, no wheezes, crackles, rhonchi. No retractions. No resp. distress. No accessory muscle use. ABD: S, NT, ND, +BS. No rebound. No HSM. EXTR: No c/c/e PSYCH: Normally interactive. Conversant.    Assessment and Plan: Physical exam  Pre-diabetes  Screening for hyperlipidemia  Screening for deficiency anemia  Screening for thyroid  disorder  Encounter for screening mammogram for malignant neoplasm of breast  Immunization due  Assessment & Plan Lumbar disc bulges with central canal stenosis and left leg and hip symptoms Chronic lumbar disc bulges at L3-L4 and L4-L5 with moderate central canal stenosis. MRI showed mild findings, not warranting surgery. Prefers non-surgical management. - Refer to physical therapy at [facility] for left leg and hip strengthening. - Encourage continuation of exercise regimen including running, hiking, and strength training.  Adult Wellness Visit Routine adult wellness visit. Discussed general health maintenance, including vaccinations and screenings. - Administer tetanus booster. - Order routine blood work including blood counts, metabolic panel, blood sugar, cholesterol, and thyroid  function. - Order mammogram. - Recommend COVID booster in the fall, send prescription to [facility] if needed.  Prediabetes Managed with lifestyle modifications.  Cervical cancer and abnormal Pap smear Cervical cancer with previous abnormal Pap smear. Last vaginal cuff Pap was normal. - Repeat Pap in 3-5 years, with cessation at age 53 if no further abnormalities.  Signed Harlene Schroeder, MD  Received labs, message to patient Results for orders placed or performed in visit on  04/23/24  CBC   Collection Time: 04/23/24  9:48 AM  Result Value Ref Range   WBC 3.6 (L) 4.0 - 10.5 K/uL   RBC 4.49 3.87 - 5.11 Mil/uL   Platelets 289.0 150.0 - 400.0 K/uL   Hemoglobin 12.6 12.0 - 15.0 g/dL   HCT 61.8 63.9 - 53.9 %   MCV 84.9 78.0 - 100.0 fl   MCHC 33.2 30.0 - 36.0 g/dL   RDW 85.5 88.4 - 84.4 %  Comprehensive metabolic panel with GFR   Collection Time: 04/23/24  9:48 AM  Result Value Ref Range   Sodium 138 135 - 145 mEq/L   Potassium 4.4 3.5 - 5.1 mEq/L   Chloride 101 96 - 112 mEq/L   CO2 32 19 - 32 mEq/L   Glucose, Bld 90 70 - 99 mg/dL   BUN 15 6 - 23 mg/dL   Creatinine, Ser 9.34 0.40 - 1.20 mg/dL   Total Bilirubin 0.5 0.2 - 1.2 mg/dL   Alkaline Phosphatase 67 39 -  117 U/L   AST 21 0 - 37 U/L   ALT 17 0 - 35 U/L   Total Protein 7.4 6.0 - 8.3 g/dL   Albumin 4.2 3.5 - 5.2 g/dL   GFR 03.56 >39.99 mL/min   Calcium 9.7 8.4 - 10.5 mg/dL  Hemoglobin J8r   Collection Time: 04/23/24  9:48 AM  Result Value Ref Range   Hgb A1c MFr Bld 6.2 4.6 - 6.5 %  Lipid panel   Collection Time: 04/23/24  9:48 AM  Result Value Ref Range   Cholesterol 250 (H) 0 - 200 mg/dL   Triglycerides 40.9 0.0 - 149.0 mg/dL   HDL 05.59 >60.99 mg/dL   VLDL 88.1 0.0 - 59.9 mg/dL   LDL Cholesterol 856 (H) 0 - 99 mg/dL   Total CHOL/HDL Ratio 3    NonHDL 155.19   TSH   Collection Time: 04/23/24  9:48 AM  Result Value Ref Range   TSH 1.18 0.35 - 5.50 uIU/mL

## 2024-04-23 ENCOUNTER — Encounter: Payer: Self-pay | Admitting: Family Medicine

## 2024-04-23 ENCOUNTER — Ambulatory Visit: Admitting: Family Medicine

## 2024-04-23 VITALS — BP 134/80 | HR 60 | Temp 98.3°F | Ht 68.0 in | Wt 198.2 lb

## 2024-04-23 DIAGNOSIS — Z0000 Encounter for general adult medical examination without abnormal findings: Secondary | ICD-10-CM | POA: Diagnosis not present

## 2024-04-23 DIAGNOSIS — Z1329 Encounter for screening for other suspected endocrine disorder: Secondary | ICD-10-CM | POA: Diagnosis not present

## 2024-04-23 DIAGNOSIS — Z23 Encounter for immunization: Secondary | ICD-10-CM | POA: Diagnosis not present

## 2024-04-23 DIAGNOSIS — R7303 Prediabetes: Secondary | ICD-10-CM | POA: Diagnosis not present

## 2024-04-23 DIAGNOSIS — Z130 Encounter for screening for diseases of the blood and blood-forming organs and certain disorders involving the immune mechanism: Secondary | ICD-10-CM | POA: Diagnosis not present

## 2024-04-23 DIAGNOSIS — Z1231 Encounter for screening mammogram for malignant neoplasm of breast: Secondary | ICD-10-CM

## 2024-04-23 DIAGNOSIS — Z13220 Encounter for screening for lipoid disorders: Secondary | ICD-10-CM

## 2024-04-23 DIAGNOSIS — M79605 Pain in left leg: Secondary | ICD-10-CM

## 2024-04-23 DIAGNOSIS — D72819 Decreased white blood cell count, unspecified: Secondary | ICD-10-CM

## 2024-04-23 LAB — COMPREHENSIVE METABOLIC PANEL WITH GFR
ALT: 17 U/L (ref 0–35)
AST: 21 U/L (ref 0–37)
Albumin: 4.2 g/dL (ref 3.5–5.2)
Alkaline Phosphatase: 67 U/L (ref 39–117)
BUN: 15 mg/dL (ref 6–23)
CO2: 32 meq/L (ref 19–32)
Calcium: 9.7 mg/dL (ref 8.4–10.5)
Chloride: 101 meq/L (ref 96–112)
Creatinine, Ser: 0.65 mg/dL (ref 0.40–1.20)
GFR: 96.43 mL/min (ref >60.00)
Glucose, Bld: 90 mg/dL (ref 70–99)
Potassium: 4.4 meq/L (ref 3.5–5.1)
Sodium: 138 meq/L (ref 135–145)
Total Bilirubin: 0.5 mg/dL (ref 0.2–1.2)
Total Protein: 7.4 g/dL (ref 6.0–8.3)

## 2024-04-23 LAB — HEMOGLOBIN A1C: Hgb A1c MFr Bld: 6.2 % (ref 4.6–6.5)

## 2024-04-23 LAB — LIPID PANEL
Cholesterol: 250 mg/dL — ABNORMAL HIGH (ref 0–200)
HDL: 94.4 mg/dL (ref >39.00)
LDL Cholesterol: 143 mg/dL — ABNORMAL HIGH (ref 0–99)
NonHDL: 155.19
Total CHOL/HDL Ratio: 3
Triglycerides: 59 mg/dL (ref 0.0–149.0)
VLDL: 11.8 mg/dL (ref 0.0–40.0)

## 2024-04-23 LAB — CBC
HCT: 38.1 % (ref 36.0–46.0)
Hemoglobin: 12.6 g/dL (ref 12.0–15.0)
MCHC: 33.2 g/dL (ref 30.0–36.0)
MCV: 84.9 fl (ref 78.0–100.0)
Platelets: 289 K/uL (ref 150.0–400.0)
RBC: 4.49 Mil/uL (ref 3.87–5.11)
RDW: 14.4 % (ref 11.5–15.5)
WBC: 3.6 K/uL — ABNORMAL LOW (ref 4.0–10.5)

## 2024-04-23 LAB — TSH: TSH: 1.18 u[IU]/mL (ref 0.35–5.50)

## 2024-04-23 MED ORDER — COVID-19 MRNA VAC-TRIS(PFIZER) 30 MCG/0.3ML IM SUSY: 0.3000 mL | PREFILLED_SYRINGE | Freq: Once | INTRAMUSCULAR | 0 refills | Status: AC

## 2024-05-02 ENCOUNTER — Ambulatory Visit: Admitting: Physical Therapy

## 2024-05-11 ENCOUNTER — Emergency Department (HOSPITAL_COMMUNITY)

## 2024-05-11 ENCOUNTER — Encounter (HOSPITAL_COMMUNITY): Payer: Self-pay

## 2024-05-11 ENCOUNTER — Emergency Department (HOSPITAL_COMMUNITY)
Admission: EM | Admit: 2024-05-11 | Discharge: 2024-05-11 | Disposition: A | Discharge status: Home / Self Care | Attending: Emergency Medicine | Admitting: Emergency Medicine

## 2024-05-11 ENCOUNTER — Other Ambulatory Visit: Payer: Self-pay

## 2024-05-11 DIAGNOSIS — G939 Disorder of brain, unspecified: Secondary | ICD-10-CM | POA: Insufficient documentation

## 2024-05-11 DIAGNOSIS — S32028A Other fracture of second lumbar vertebra, initial encounter for closed fracture: Secondary | ICD-10-CM | POA: Diagnosis not present

## 2024-05-11 DIAGNOSIS — E041 Nontoxic single thyroid nodule: Secondary | ICD-10-CM | POA: Diagnosis not present

## 2024-05-11 DIAGNOSIS — S0990XA Unspecified injury of head, initial encounter: Secondary | ICD-10-CM | POA: Insufficient documentation

## 2024-05-11 DIAGNOSIS — Y92410 Unspecified street and highway as the place of occurrence of the external cause: Secondary | ICD-10-CM | POA: Diagnosis not present

## 2024-05-11 DIAGNOSIS — S32048A Other fracture of fourth lumbar vertebra, initial encounter for closed fracture: Secondary | ICD-10-CM | POA: Insufficient documentation

## 2024-05-11 DIAGNOSIS — M545 Low back pain, unspecified: Secondary | ICD-10-CM | POA: Diagnosis present

## 2024-05-11 DIAGNOSIS — S32008A Other fracture of unspecified lumbar vertebra, initial encounter for closed fracture: Secondary | ICD-10-CM

## 2024-05-11 LAB — CBC WITH DIFFERENTIAL/PLATELET
Abs Immature Granulocytes: 0.12 K/uL — ABNORMAL HIGH (ref 0.00–0.07)
Basophils Absolute: 0 K/uL (ref 0.0–0.1)
Basophils Relative: 1 %
Eosinophils Absolute: 0.1 K/uL (ref 0.0–0.5)
Eosinophils Relative: 2 %
HCT: 36.6 % (ref 36.0–46.0)
Hemoglobin: 11.9 g/dL — ABNORMAL LOW (ref 12.0–15.0)
Immature Granulocytes: 2 %
Lymphocytes Relative: 24 %
Lymphs Abs: 1.5 K/uL (ref 0.7–4.0)
MCH: 28.3 pg (ref 26.0–34.0)
MCHC: 32.5 g/dL (ref 30.0–36.0)
MCV: 87.1 fL (ref 80.0–100.0)
Monocytes Absolute: 0.5 K/uL (ref 0.1–1.0)
Monocytes Relative: 7 %
Neutro Abs: 4.2 K/uL (ref 1.7–7.7)
Neutrophils Relative %: 64 %
Platelets: 276 K/uL (ref 150–400)
RBC: 4.2 MIL/uL (ref 3.87–5.11)
RDW: 13.1 % (ref 11.5–15.5)
WBC: 6.5 K/uL (ref 4.0–10.5)
nRBC: 0 % (ref 0.0–0.2)

## 2024-05-11 LAB — BASIC METABOLIC PANEL WITH GFR
Anion gap: 11 (ref 5–15)
BUN: 10 mg/dL (ref 6–20)
CO2: 24 mmol/L (ref 22–32)
Calcium: 8.7 mg/dL — ABNORMAL LOW (ref 8.9–10.3)
Chloride: 104 mmol/L (ref 98–111)
Creatinine, Ser: 0.83 mg/dL (ref 0.44–1.00)
GFR, Estimated: >60 mL/min (ref >60)
Glucose, Bld: 107 mg/dL — ABNORMAL HIGH (ref 70–99)
Potassium: 3.9 mmol/L (ref 3.5–5.1)
Sodium: 139 mmol/L (ref 135–145)

## 2024-05-11 LAB — HCG, SERUM, QUALITATIVE: Preg, Serum: NEGATIVE

## 2024-05-11 MED ORDER — OXYCODONE-ACETAMINOPHEN 5-325 MG PO TABS: 1.0000 | ORAL_TABLET | Freq: Three times a day (TID) | ORAL | 0 refills | Status: DC | PRN

## 2024-05-11 MED ORDER — IOHEXOL 350 MG/ML SOLN
75.0000 mL | Freq: Once | INTRAVENOUS | Status: AC | PRN
Start: 2024-05-11 — End: 2024-05-11
  Administered 2024-05-11: 75 mL via INTRAVENOUS

## 2024-05-11 MED ORDER — OXYCODONE-ACETAMINOPHEN 5-325 MG PO TABS
1.0000 | ORAL_TABLET | Freq: Once | ORAL | Status: AC
  Administered 2024-05-11: 1 via ORAL
  Filled 2024-05-11: qty 1

## 2024-05-11 MED ORDER — MORPHINE SULFATE (PF) 4 MG/ML IV SOLN
4.0000 mg | Freq: Once | INTRAVENOUS | Status: AC
  Administered 2024-05-11: 4 mg via INTRAVENOUS
  Filled 2024-05-11: qty 1
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
## 2024-05-11 NOTE — ED Notes (Signed)
 Patient transported to CT
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
## 2024-05-11 NOTE — ED Provider Notes (Signed)
 OK to bypass labs for CT   Pamella Ozell LABOR, DO 05/11/24 8175

## 2024-05-13 ENCOUNTER — Encounter: Payer: Self-pay | Admitting: Family Medicine

## 2024-05-14 ENCOUNTER — Ambulatory Visit: Payer: Self-pay
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
## 2024-05-16 ENCOUNTER — Encounter: Payer: Self-pay | Admitting: Family Medicine

## 2024-05-16 ENCOUNTER — Telehealth: Admitting: Family Medicine

## 2024-05-16 ENCOUNTER — Telehealth: Payer: Self-pay | Admitting: Family Medicine

## 2024-05-16 VITALS — Ht 68.0 in

## 2024-05-16 DIAGNOSIS — E041 Nontoxic single thyroid nodule: Secondary | ICD-10-CM | POA: Diagnosis not present

## 2024-05-16 DIAGNOSIS — S32049D Unspecified fracture of fourth lumbar vertebra, subsequent encounter for fracture with routine healing: Secondary | ICD-10-CM

## 2024-05-16 DIAGNOSIS — S32029D Unspecified fracture of second lumbar vertebra, subsequent encounter for fracture with routine healing: Secondary | ICD-10-CM

## 2024-05-16 DIAGNOSIS — S32049A Unspecified fracture of fourth lumbar vertebra, initial encounter for closed fracture: Secondary | ICD-10-CM

## 2024-05-16 MED ORDER — OXYCODONE-ACETAMINOPHEN 5-325 MG PO TABS: 1.0000 | ORAL_TABLET | Freq: Three times a day (TID) | ORAL | 0 refills | Status: AC | PRN

## 2024-05-16 NOTE — Telephone Encounter (Signed)
 Forms have been placed in PCP folder for review. Will call pts once forms have been faxed.

## 2024-05-16 NOTE — Telephone Encounter (Signed)
 Pt dropped off FMLA paper work to be filled out by [employer]. Placed papers in pcps box. Please call pt when papers have been faxed.

## 2024-05-17 ENCOUNTER — Inpatient Hospital Stay (HOSPITAL_BASED_OUTPATIENT_CLINIC_OR_DEPARTMENT_OTHER): Admission: RE | Admit: 2024-05-17

## 2024-05-17 ENCOUNTER — Telehealth (HOSPITAL_BASED_OUTPATIENT_CLINIC_OR_DEPARTMENT_OTHER): Payer: Self-pay

## 2024-05-31 ENCOUNTER — Ambulatory Visit (HOSPITAL_BASED_OUTPATIENT_CLINIC_OR_DEPARTMENT_OTHER)
Admission: RE | Admit: 2024-05-31 | Discharge: 2024-05-31 | Disposition: A | Discharge status: Home / Self Care | Source: Ambulatory Visit | Attending: Family Medicine | Admitting: Family Medicine

## 2024-05-31 ENCOUNTER — Encounter: Payer: Self-pay | Admitting: Family Medicine

## 2024-05-31 ENCOUNTER — Encounter (HOSPITAL_BASED_OUTPATIENT_CLINIC_OR_DEPARTMENT_OTHER): Payer: Self-pay

## 2024-05-31 DIAGNOSIS — Z1231 Encounter for screening mammogram for malignant neoplasm of breast: Secondary | ICD-10-CM | POA: Insufficient documentation

## 2024-05-31 DIAGNOSIS — E041 Nontoxic single thyroid nodule: Secondary | ICD-10-CM

## 2024-06-01 ENCOUNTER — Telehealth: Payer: Self-pay | Admitting: Family Medicine

## 2024-06-01 NOTE — Telephone Encounter (Signed)
Form has been placed in PCP folder for review.

## 2024-06-01 NOTE — Telephone Encounter (Signed)
 Pt dropped off fmla papers to be completed by pcp. Pt asks that we fax them and call her to let them know they were faxed. Placed in pcps tray in fo.

## 2024-06-03 ENCOUNTER — Encounter: Payer: Self-pay | Admitting: Family Medicine

## 2024-06-03 ENCOUNTER — Other Ambulatory Visit: Payer: Self-pay | Admitting: Family Medicine

## 2024-06-03 DIAGNOSIS — E041 Nontoxic single thyroid nodule: Secondary | ICD-10-CM

## 2024-06-06 NOTE — Telephone Encounter (Signed)
 Form has been faxed to number provide, a copy has been made to add to pts chart. Original copy has been left up front for pt pick up.

## 2024-06-26 ENCOUNTER — Ambulatory Visit (HOSPITAL_COMMUNITY)
Admission: RE | Admit: 2024-06-26 | Discharge: 2024-06-26 | Disposition: A | Discharge status: Home / Self Care | Source: Ambulatory Visit | Attending: Family Medicine | Admitting: Family Medicine

## 2024-06-26 DIAGNOSIS — E041 Nontoxic single thyroid nodule: Secondary | ICD-10-CM | POA: Insufficient documentation

## 2024-06-26 MED ORDER — LIDOCAINE HCL (PF) 1 % IJ SOLN
5.0000 mL | Freq: Once | INTRAMUSCULAR | Status: AC
  Administered 2024-06-26: 5 mL via INTRADERMAL

## 2024-06-26 NOTE — Procedures (Signed)
 PROCEDURE SUMMARY:  Using direct ultrasound guidance, 5 passes were made using 25 g needles into the nodule within the left lobe of the thyroid .   Ultrasound was used to confirm needle placements on all occasions.   EBL = trace  Specimens were sent to Pathology for analysis.  See procedure note under Imaging tab in Epic for full procedure details.    Electronically Signed: Carlin DELENA Griffon, PA-C 06/26/2024, 1:16 PM

## 2024-06-27 LAB — CYTOLOGY - NON PAP

## 2024-06-28 ENCOUNTER — Encounter: Payer: Self-pay | Admitting: Family Medicine

## 2024-06-28 DIAGNOSIS — E041 Nontoxic single thyroid nodule: Secondary | ICD-10-CM
